# Patient Record
Sex: Female | Born: 1950 | Race: Black or African American | Hispanic: No | Marital: Married | Smoking: Former smoker
Health system: Southern US, Community
[De-identification: ages and names within clinical notes are randomized; demographics above are authoritative.]

## PROBLEM LIST (undated history)

## (undated) ENCOUNTER — Emergency Department (HOSPITAL_COMMUNITY): Admission: EM | Payer: 59 | Source: Home / Self Care

## (undated) DIAGNOSIS — M25519 Pain in unspecified shoulder: Secondary | ICD-10-CM

## (undated) DIAGNOSIS — K529 Noninfective gastroenteritis and colitis, unspecified: Secondary | ICD-10-CM

## (undated) DIAGNOSIS — Z87442 Personal history of urinary calculi: Secondary | ICD-10-CM

## (undated) DIAGNOSIS — E119 Type 2 diabetes mellitus without complications: Secondary | ICD-10-CM

## (undated) DIAGNOSIS — N189 Chronic kidney disease, unspecified: Secondary | ICD-10-CM

## (undated) DIAGNOSIS — J45909 Unspecified asthma, uncomplicated: Secondary | ICD-10-CM

## (undated) DIAGNOSIS — M1990 Unspecified osteoarthritis, unspecified site: Secondary | ICD-10-CM

## (undated) DIAGNOSIS — I10 Essential (primary) hypertension: Secondary | ICD-10-CM

## (undated) HISTORY — PX: OTHER SURGICAL HISTORY: SHX169

## (undated) HISTORY — DX: Noninfective gastroenteritis and colitis, unspecified: K52.9

## (undated) HISTORY — PX: TONSILLECTOMY: SUR1361

## (undated) HISTORY — PX: ROTATOR CUFF REPAIR: SHX139

## (undated) HISTORY — PX: ABDOMINAL HYSTERECTOMY: SHX81

---

## 2003-03-02 ENCOUNTER — Encounter: Payer: Self-pay | Admitting: *Deleted

## 2003-03-02 ENCOUNTER — Ambulatory Visit (HOSPITAL_COMMUNITY): Admission: RE | Admit: 2003-03-02 | Discharge: 2003-03-02 | Payer: Self-pay | Admitting: Family Medicine

## 2005-01-07 ENCOUNTER — Ambulatory Visit (HOSPITAL_COMMUNITY): Admission: RE | Admit: 2005-01-07 | Discharge: 2005-01-07 | Payer: Self-pay | Admitting: Occupational Therapy

## 2006-02-03 ENCOUNTER — Ambulatory Visit (HOSPITAL_COMMUNITY): Admission: RE | Admit: 2006-02-03 | Discharge: 2006-02-03 | Payer: Self-pay | Admitting: Nurse Practitioner

## 2006-02-26 ENCOUNTER — Ambulatory Visit (HOSPITAL_COMMUNITY): Admission: RE | Admit: 2006-02-26 | Discharge: 2006-02-26 | Payer: Self-pay | Admitting: Nurse Practitioner

## 2007-04-03 ENCOUNTER — Ambulatory Visit (HOSPITAL_COMMUNITY): Admission: RE | Admit: 2007-04-03 | Discharge: 2007-04-03 | Payer: Self-pay | Admitting: Nurse Practitioner

## 2007-11-12 ENCOUNTER — Emergency Department (HOSPITAL_COMMUNITY): Admission: EM | Admit: 2007-11-12 | Discharge: 2007-11-12 | Payer: Self-pay | Admitting: Emergency Medicine

## 2008-02-29 ENCOUNTER — Encounter: Payer: Self-pay | Admitting: Internal Medicine

## 2008-02-29 ENCOUNTER — Ambulatory Visit (HOSPITAL_COMMUNITY): Admission: RE | Admit: 2008-02-29 | Discharge: 2008-02-29 | Payer: Self-pay | Admitting: Internal Medicine

## 2008-02-29 ENCOUNTER — Ambulatory Visit: Payer: Self-pay | Admitting: Internal Medicine

## 2008-02-29 HISTORY — PX: COLONOSCOPY: SHX174

## 2008-04-05 ENCOUNTER — Ambulatory Visit (HOSPITAL_COMMUNITY): Admission: RE | Admit: 2008-04-05 | Discharge: 2008-04-05 | Payer: Self-pay | Admitting: Internal Medicine

## 2009-04-28 ENCOUNTER — Ambulatory Visit (HOSPITAL_COMMUNITY): Admission: RE | Admit: 2009-04-28 | Discharge: 2009-04-28 | Payer: Self-pay | Admitting: Internal Medicine

## 2010-03-10 ENCOUNTER — Emergency Department (HOSPITAL_COMMUNITY): Admission: EM | Admit: 2010-03-10 | Discharge: 2010-03-10 | Payer: Self-pay | Admitting: Emergency Medicine

## 2010-03-28 ENCOUNTER — Ambulatory Visit (HOSPITAL_COMMUNITY)
Admission: RE | Admit: 2010-03-28 | Discharge: 2010-03-28 | Payer: Self-pay | Source: Home / Self Care | Admitting: Family Medicine

## 2010-08-03 ENCOUNTER — Ambulatory Visit (HOSPITAL_COMMUNITY)
Admission: RE | Admit: 2010-08-03 | Discharge: 2010-08-03 | Payer: Self-pay | Source: Home / Self Care | Attending: Family Medicine | Admitting: Family Medicine

## 2010-12-11 NOTE — Op Note (Signed)
NAMECARMINE, YOUNGBERG               ACCOUNT NO.:  [id number]   MEDICAL RECORD NO.:  [id number]          PATIENT TYPE:  AMB   LOCATION:  DAY                           FACILITY:  [facility]   PHYSICIAN:  R. Roetta Sessions, M.D. DATE OF BIRTH:  26-Feb-1951   DATE OF PROCEDURE:  02/29/2008  DATE OF DISCHARGE:                               OPERATIVE REPORT   INDICATIONS FOR PROCEDURE:  The patient is a 60 year old African  American female from [place], who has a history of colonic polyps  removed by Dr. Allena Katz 8 years ago.  She has done well.  She is here for  surveillance.  She is referred by Dr. Claretta Fraise, [facility].  Colonoscopy is now being done as surveillance maneuver.  Risks, benefits, alternatives, and limitations have been reviewed.  Questions answered, all parties agreeable.  All questions answered.   PROCEDURE NOTE:  O2 saturation, blood pressure, pulse, and respirations  were monitored throughout the entire procedure.   CONSCIOUS SEDATION:  Versed 4 mg IV and Demerol 75 mg IV, in divided  doses.   INSTRUMENT:  Pentax video chip system.   FINDINGS:  Digital rectal exam revealed no abnormalities.  Endoscopic  findings:  Prep was adequate.  Colon:  Colonic mucosa was surveyed from  the rectosigmoid junction through the left transverse right colon to the  appendiceal orifice, ileocecal valve, and cecum.  The structures were  well seen and photographed for the record.  From this level, the scope  was slowly and cautiously withdrawn.  All previously mentioned mucosal  surfaces were again seen.  The patient was noted to have 2 diminutive  polyps in the cecum, which were cold biopsied/removed.  The remainder of  colonic mucosa appeared entirely normal.  The scope was pulled down the  rectum where thorough examination of the rectal mucosa including  retroflex view of the anal verge demonstrated a number of diminutive  polyps at least one small adenomatous-appearing  polyp and this was cold  biopsied.  The remainder were ablated with the tip of hot snare cautery  unit.  Otherwise, rectal mucosa appeared normal.  The patient tolerated  the procedure well and was reacted to endoscopy.   IMPRESSION:  1. Diminutive rectal polyp, status post biopsy and ablation as      described above.  2. Diminutive cecal polyps (2), status post cold biopsy removed.  The      remainder of colonic mucosa appeared normal.   RECOMMENDATIONS:  1. Follow up on path.  2. Further recommendations to follow.      Jonathon Bellows, M.D.  Electronically Signed     RMR/MEDQ  D:  02/29/2008  T:  02/29/2008  Job:  [id number]   cc:   Clair Gulling, Lucianne Lei [id number]  [place]

## 2011-04-23 LAB — COMPREHENSIVE METABOLIC PANEL
Alkaline Phosphatase: 98
Creatinine, Ser: 0.79
Glucose, Bld: 100 — ABNORMAL HIGH
Sodium: 139
Total Protein: 7.3

## 2011-04-23 LAB — DIFFERENTIAL
Basophils Absolute: 0
Eosinophils Absolute: 0.3
Monocytes Relative: 7
Neutro Abs: 5.5

## 2011-04-23 LAB — CBC
HCT: 41.1
Hemoglobin: 14.3
MCHC: 34.7
MCV: 88.7
Platelets: 201
RBC: 4.63
RDW: 13.6
WBC: 8.3

## 2011-04-23 LAB — AMYLASE: Amylase: 123

## 2011-04-25 ENCOUNTER — Other Ambulatory Visit (HOSPITAL_COMMUNITY): Payer: Self-pay | Admitting: Family Medicine

## 2011-04-25 DIAGNOSIS — Z139 Encounter for screening, unspecified: Secondary | ICD-10-CM

## 2011-05-17 ENCOUNTER — Ambulatory Visit (HOSPITAL_COMMUNITY)
Admission: RE | Admit: 2011-05-17 | Discharge: 2011-05-17 | Disposition: A | Discharge status: Home / Self Care | Payer: Managed Care, Other (non HMO) | Source: Ambulatory Visit | Attending: Family Medicine | Admitting: Family Medicine

## 2011-05-17 DIAGNOSIS — Z139 Encounter for screening, unspecified: Secondary | ICD-10-CM

## 2011-05-17 DIAGNOSIS — Z1231 Encounter for screening mammogram for malignant neoplasm of breast: Secondary | ICD-10-CM | POA: Insufficient documentation

## 2012-05-28 ENCOUNTER — Other Ambulatory Visit (HOSPITAL_COMMUNITY): Payer: Self-pay | Admitting: Family Medicine

## 2012-05-28 DIAGNOSIS — Z139 Encounter for screening, unspecified: Secondary | ICD-10-CM

## 2012-06-04 ENCOUNTER — Ambulatory Visit (HOSPITAL_COMMUNITY)
Admission: RE | Admit: 2012-06-04 | Discharge: 2012-06-04 | Disposition: A | Discharge status: Home / Self Care | Payer: Managed Care, Other (non HMO) | Source: Ambulatory Visit | Attending: Family Medicine | Admitting: Family Medicine

## 2012-06-04 DIAGNOSIS — Z139 Encounter for screening, unspecified: Secondary | ICD-10-CM

## 2012-06-04 DIAGNOSIS — Z1231 Encounter for screening mammogram for malignant neoplasm of breast: Secondary | ICD-10-CM | POA: Insufficient documentation

## 2013-04-28 DIAGNOSIS — K529 Noninfective gastroenteritis and colitis, unspecified: Secondary | ICD-10-CM

## 2013-04-28 HISTORY — DX: Noninfective gastroenteritis and colitis, unspecified: K52.9

## 2013-05-04 ENCOUNTER — Emergency Department (HOSPITAL_COMMUNITY): Payer: 59

## 2013-05-04 ENCOUNTER — Inpatient Hospital Stay (HOSPITAL_COMMUNITY)
Admission: EM | Admit: 2013-05-04 | Discharge: 2013-05-06 | DRG: 392 | Disposition: A | Discharge status: Home / Self Care | Payer: 59 | Attending: Internal Medicine | Admitting: Internal Medicine

## 2013-05-04 ENCOUNTER — Encounter (HOSPITAL_COMMUNITY): Payer: Self-pay | Admitting: *Deleted

## 2013-05-04 ENCOUNTER — Telehealth: Payer: Self-pay

## 2013-05-04 DIAGNOSIS — D72829 Elevated white blood cell count, unspecified: Secondary | ICD-10-CM | POA: Diagnosis present

## 2013-05-04 DIAGNOSIS — K529 Noninfective gastroenteritis and colitis, unspecified: Secondary | ICD-10-CM

## 2013-05-04 DIAGNOSIS — K5289 Other specified noninfective gastroenteritis and colitis: Principal | ICD-10-CM | POA: Diagnosis present

## 2013-05-04 DIAGNOSIS — J45909 Unspecified asthma, uncomplicated: Secondary | ICD-10-CM | POA: Diagnosis present

## 2013-05-04 DIAGNOSIS — Z87891 Personal history of nicotine dependence: Secondary | ICD-10-CM

## 2013-05-04 DIAGNOSIS — I10 Essential (primary) hypertension: Secondary | ICD-10-CM | POA: Diagnosis present

## 2013-05-04 DIAGNOSIS — K921 Melena: Secondary | ICD-10-CM

## 2013-05-04 DIAGNOSIS — E119 Type 2 diabetes mellitus without complications: Secondary | ICD-10-CM | POA: Diagnosis present

## 2013-05-04 DIAGNOSIS — E669 Obesity, unspecified: Secondary | ICD-10-CM | POA: Diagnosis present

## 2013-05-04 DIAGNOSIS — Z23 Encounter for immunization: Secondary | ICD-10-CM

## 2013-05-04 DIAGNOSIS — E1169 Type 2 diabetes mellitus with other specified complication: Secondary | ICD-10-CM

## 2013-05-04 DIAGNOSIS — E876 Hypokalemia: Secondary | ICD-10-CM | POA: Diagnosis present

## 2013-05-04 HISTORY — DX: Unspecified asthma, uncomplicated: J45.909

## 2013-05-04 HISTORY — DX: Type 2 diabetes mellitus without complications: E11.9

## 2013-05-04 HISTORY — DX: Essential (primary) hypertension: I10

## 2013-05-04 HISTORY — DX: Pain in unspecified shoulder: M25.519

## 2013-05-04 LAB — URINALYSIS W MICROSCOPIC + REFLEX CULTURE
Bilirubin Urine: NEGATIVE
Glucose, UA: NEGATIVE mg/dL
Hgb urine dipstick: NEGATIVE
Ketones, ur: NEGATIVE mg/dL
Protein, ur: NEGATIVE mg/dL
Specific Gravity, Urine: 1.02 (ref 1.005–1.030)

## 2013-05-04 LAB — CBC WITH DIFFERENTIAL/PLATELET
Basophils Relative: 0 % (ref 0–1)
Lymphocytes Relative: 28 % (ref 12–46)
MCH: 30 pg (ref 26.0–34.0)
MCHC: 33.7 g/dL (ref 30.0–36.0)
Monocytes Absolute: 0.8 10*3/uL (ref 0.1–1.0)
Monocytes Relative: 6 % (ref 3–12)
RBC: 4.87 MIL/uL (ref 3.87–5.11)
RDW: 13.6 % (ref 11.5–15.5)
WBC: 12.7 10*3/uL — ABNORMAL HIGH (ref 4.0–10.5)

## 2013-05-04 LAB — LIPASE, BLOOD: Lipase: 21 U/L (ref 11–59)

## 2013-05-04 LAB — OCCULT BLOOD, POC DEVICE: Fecal Occult Bld: POSITIVE — AB

## 2013-05-04 LAB — COMPREHENSIVE METABOLIC PANEL
AST: 22 U/L (ref 0–37)
Albumin: 3.8 g/dL (ref 3.5–5.2)
Alkaline Phosphatase: 84 U/L (ref 39–117)
CO2: 26 mEq/L (ref 19–32)
Chloride: 101 mEq/L (ref 96–112)
Creatinine, Ser: 0.68 mg/dL (ref 0.50–1.10)
GFR calc Af Amer: >90 mL/min (ref >90)
GFR calc non Af Amer: >90 mL/min (ref >90)
Glucose, Bld: 104 mg/dL — ABNORMAL HIGH (ref 70–99)
Potassium: 3 mEq/L — ABNORMAL LOW (ref 3.5–5.1)
Total Bilirubin: 0.3 mg/dL (ref 0.3–1.2)
Total Protein: 7.8 g/dL (ref 6.0–8.3)

## 2013-05-04 LAB — LACTIC ACID, PLASMA: Lactic Acid, Venous: 1.3 mmol/L (ref 0.5–2.2)

## 2013-05-04 MED ORDER — FLUTICASONE PROPIONATE 50 MCG/ACT NA SUSP
1.0000 | Freq: Every day | NASAL | Status: DC
  Administered 2013-05-05 – 2013-05-06 (×2): 1 via NASAL
  Filled 2013-05-04: qty 16

## 2013-05-04 MED ORDER — ONDANSETRON HCL 4 MG/2ML IJ SOLN: 4.0000 mg | Freq: Four times a day (QID) | INTRAMUSCULAR | Status: DC | PRN

## 2013-05-04 MED ORDER — LISINOPRIL 10 MG PO TABS
20.0000 mg | ORAL_TABLET | Freq: Every day | ORAL | Status: DC
  Administered 2013-05-05 – 2013-05-06 (×2): 20 mg via ORAL
  Filled 2013-05-04 (×2): qty 2

## 2013-05-04 MED ORDER — SODIUM CHLORIDE 0.9 % IV SOLN
INTRAVENOUS | Status: DC
  Administered 2013-05-04 – 2013-05-05 (×2): via INTRAVENOUS

## 2013-05-04 MED ORDER — LISINOPRIL-HYDROCHLOROTHIAZIDE 20-25 MG PO TABS: 1.0000 | ORAL_TABLET | Freq: Every day | ORAL | Status: DC

## 2013-05-04 MED ORDER — HYDROCHLOROTHIAZIDE 25 MG PO TABS
25.0000 mg | ORAL_TABLET | Freq: Every day | ORAL | Status: DC
  Administered 2013-05-05 – 2013-05-06 (×2): 25 mg via ORAL
  Filled 2013-05-04 (×2): qty 1

## 2013-05-04 MED ORDER — FLUTICASONE PROPIONATE 50 MCG/ACT NA SUSP
NASAL | Status: AC
  Filled 2013-05-04: qty 16

## 2013-05-04 MED ORDER — SIMVASTATIN 10 MG PO TABS
10.0000 mg | ORAL_TABLET | Freq: Every day | ORAL | Status: DC
  Administered 2013-05-05: 10 mg via ORAL
  Filled 2013-05-04: qty 1

## 2013-05-04 MED ORDER — MORPHINE SULFATE 4 MG/ML IJ SOLN
4.0000 mg | INTRAMUSCULAR | Status: AC | PRN
  Administered 2013-05-04 (×2): 4 mg via INTRAVENOUS
  Filled 2013-05-04 (×2): qty 1

## 2013-05-04 MED ORDER — ACETAMINOPHEN 325 MG PO TABS
650.0000 mg | ORAL_TABLET | Freq: Four times a day (QID) | ORAL | Status: DC | PRN
  Administered 2013-05-05 (×2): 650 mg via ORAL
  Filled 2013-05-04 (×3): qty 2

## 2013-05-04 MED ORDER — SODIUM CHLORIDE 0.9 % IV SOLN
INTRAVENOUS | Status: AC
  Administered 2013-05-04: 21:00:00 via INTRAVENOUS

## 2013-05-04 MED ORDER — ONDANSETRON HCL 4 MG/2ML IJ SOLN: 4.0000 mg | Freq: Three times a day (TID) | INTRAMUSCULAR | Status: AC | PRN

## 2013-05-04 MED ORDER — LORATADINE 10 MG PO TABS
10.0000 mg | ORAL_TABLET | Freq: Every day | ORAL | Status: DC
  Administered 2013-05-05 – 2013-05-06 (×2): 10 mg via ORAL
  Filled 2013-05-04 (×2): qty 1

## 2013-05-04 MED ORDER — IOHEXOL 300 MG/ML  SOLN
100.0000 mL | Freq: Once | INTRAMUSCULAR | Status: AC | PRN
  Administered 2013-05-04: 100 mL via INTRAVENOUS

## 2013-05-04 MED ORDER — IOHEXOL 300 MG/ML  SOLN
50.0000 mL | Freq: Once | INTRAMUSCULAR | Status: AC | PRN
  Administered 2013-05-04: 50 mL via ORAL

## 2013-05-04 MED ORDER — ONDANSETRON HCL 4 MG PO TABS: 4.0000 mg | ORAL_TABLET | Freq: Four times a day (QID) | ORAL | Status: DC | PRN

## 2013-05-04 MED ORDER — TRAMADOL HCL 50 MG PO TABS
50.0000 mg | ORAL_TABLET | Freq: Four times a day (QID) | ORAL | Status: DC | PRN
  Administered 2013-05-04 – 2013-05-05 (×3): 50 mg via ORAL
  Filled 2013-05-04 (×3): qty 1

## 2013-05-04 MED ORDER — MONTELUKAST SODIUM 10 MG PO TABS
10.0000 mg | ORAL_TABLET | Freq: Every day | ORAL | Status: DC
  Administered 2013-05-04 – 2013-05-05 (×2): 10 mg via ORAL
  Filled 2013-05-04 (×2): qty 1

## 2013-05-04 MED ORDER — INSULIN ASPART 100 UNIT/ML ~~LOC~~ SOLN: 0.0000 [IU] | Freq: Three times a day (TID) | SUBCUTANEOUS | Status: DC

## 2013-05-04 MED ORDER — ONDANSETRON HCL 4 MG/2ML IJ SOLN
4.0000 mg | INTRAMUSCULAR | Status: DC | PRN
  Administered 2013-05-04: 4 mg via INTRAVENOUS
  Filled 2013-05-04: qty 2

## 2013-05-04 MED ORDER — NAPHAZOLINE HCL 0.1 % OP SOLN
1.0000 [drp] | Freq: Four times a day (QID) | OPHTHALMIC | Status: DC | PRN
  Filled 2013-05-04: qty 15

## 2013-05-04 MED ORDER — FLUTICASONE PROPIONATE HFA 44 MCG/ACT IN AERO
2.0000 | INHALATION_SPRAY | Freq: Two times a day (BID) | RESPIRATORY_TRACT | Status: DC
  Administered 2013-05-05 – 2013-05-06 (×3): 2 via RESPIRATORY_TRACT
  Filled 2013-05-04: qty 10.6

## 2013-05-04 MED ORDER — POTASSIUM CHLORIDE CRYS ER 20 MEQ PO TBCR
40.0000 meq | EXTENDED_RELEASE_TABLET | Freq: Once | ORAL | Status: AC
  Administered 2013-05-04: 40 meq via ORAL
  Filled 2013-05-04: qty 2

## 2013-05-04 MED ORDER — METRONIDAZOLE IN NACL 5-0.79 MG/ML-% IV SOLN
500.0000 mg | Freq: Once | INTRAVENOUS | Status: AC
  Administered 2013-05-04: 500 mg via INTRAVENOUS
  Filled 2013-05-04: qty 100

## 2013-05-04 MED ORDER — INFLUENZA VAC SPLIT QUAD 0.5 ML IM SUSP
0.5000 mL | INTRAMUSCULAR | Status: AC
  Administered 2013-05-05: 0.5 mL via INTRAMUSCULAR
  Filled 2013-05-04: qty 0.5

## 2013-05-04 MED ORDER — CIPROFLOXACIN IN D5W 400 MG/200ML IV SOLN
400.0000 mg | Freq: Once | INTRAVENOUS | Status: AC
  Administered 2013-05-04: 400 mg via INTRAVENOUS
  Filled 2013-05-04: qty 200

## 2013-05-04 MED ORDER — ENOXAPARIN SODIUM 40 MG/0.4ML ~~LOC~~ SOLN
40.0000 mg | SUBCUTANEOUS | Status: DC
  Administered 2013-05-04 – 2013-05-05 (×2): 40 mg via SUBCUTANEOUS
  Filled 2013-05-04 (×2): qty 0.4

## 2013-05-04 MED ORDER — ACETAMINOPHEN 650 MG RE SUPP: 650.0000 mg | Freq: Four times a day (QID) | RECTAL | Status: DC | PRN

## 2013-05-04 MED ORDER — ALBUTEROL SULFATE HFA 108 (90 BASE) MCG/ACT IN AERS
2.0000 | INHALATION_SPRAY | Freq: Four times a day (QID) | RESPIRATORY_TRACT | Status: DC | PRN
Start: 2013-05-04 — End: 2013-05-06

## 2013-05-04 MED ORDER — SODIUM CHLORIDE 0.9 % IV SOLN
INTRAVENOUS | Status: DC
  Administered 2013-05-04: 1000 mL via INTRAVENOUS

## 2013-05-04 MED ORDER — HYDROMORPHONE HCL PF 1 MG/ML IJ SOLN: 0.5000 mg | INTRAMUSCULAR | Status: AC | PRN

## 2013-05-04 NOTE — Telephone Encounter (Signed)
Pt is still bleeding and her pain is all the way across her stomach above her belly button. She just went to the to the bathroom and the blood was darker then before. I advised her to go to the ER to get checked out.

## 2013-05-04 NOTE — ED Notes (Signed)
Upper abdominal cramping began yesterday around 1400 w/light pink blood in stool.  Now it is darker and mucoid.  Little nausea, vomiting w/clear, phlegm-like emesis.

## 2013-05-04 NOTE — Telephone Encounter (Signed)
Pt called today because she is having abd pain  With some sharpe pains. She is bleeding from rectum and it has mucous mixed it with it. Started Monday about 2:00 pm. She has been going all night and some still this morning.Please advise her call back is [phone].

## 2013-05-04 NOTE — Telephone Encounter (Signed)
Agree 

## 2013-05-04 NOTE — ED Provider Notes (Signed)
CSN: [id number]     Arrival date & time 05/04/13  1407 History   First MD Initiated Contact with Patient 05/04/13 1430     Chief Complaint  Patient presents with  . Abdominal Pain  . Rectal Bleeding    HPI Pt was seen at 1440.  Per pt, c/o gradual onset and persistence of constant rectal bleeding for the past 2 days. Has been associated with generalized abd "cramping" pain, as well as several episodes of N/V. Describes the rectal bleeding as passing "mucus with blood" per rectum. Denies diarrhea, no black stools, no black or blood in emesis. Denies recent travel or abx use. Denies CP/SOB, no fevers, no back pain.    Past Medical History  Diagnosis Date  . Diabetes mellitus without complication   . Hypertension   . Shoulder pain   . Asthma    Past Surgical History  Procedure Laterality Date  . Rotator cuff repair    . Fibroids removed    . Abdominal hysterectomy      partial  . Tonsillectomy     Family History  Problem Relation Age of Onset  . Cancer Mother    History  Substance Use Topics  . Smoking status: Former Games developer  . Smokeless tobacco: Never Used  . Alcohol Use: 0.6 oz/week    1 Glasses of wine per week     Comment: socially    Review of Systems ROS: Statement: All systems negative except as marked or noted in the HPI; Constitutional: Negative for fever and chills. ; ; Eyes: Negative for eye pain, redness and discharge. ; ; ENMT: Negative for ear pain, hoarseness, nasal congestion, sinus pressure and sore throat. ; ; Cardiovascular: Negative for chest pain, palpitations, diaphoresis, dyspnea and peripheral edema. ; ; Respiratory: Negative for cough, wheezing and stridor. ; ; Gastrointestinal: +N/V, abd pain, rectal bleeding. Negative for hematemesis, jaundice. . ; ; Genitourinary: Negative for dysuria, flank pain and hematuria. ; ; Musculoskeletal: Negative for back pain and neck pain. Negative for swelling and trauma.; ; Skin: Negative for pruritus, rash, abrasions,  blisters, bruising and skin lesion.; ; Neuro: Negative for headache, lightheadedness and neck stiffness. Negative for weakness, altered level of consciousness , altered mental status, extremity weakness, paresthesias, involuntary movement, seizure and syncope.       Allergies  Aspirin  Home Medications   Current Outpatient Rx  Name  Route  Sig  Dispense  Refill  . albuterol (PROVENTIL HFA;VENTOLIN HFA) 108 (90 BASE) MCG/ACT inhaler   Inhalation   Inhale 2 puffs into the lungs every 6 (six) hours as needed for wheezing or shortness of breath.         . budesonide (PULMICORT) 180 MCG/ACT inhaler   Inhalation   Inhale 1 puff into the lungs 2 (two) times daily.         . Calcium Carbonate-Vitamin D (CALTRATE 600+D PO)   Oral   Take 2 tablets by mouth daily.         . Camphor-Menthol-Methyl Sal (SALONPAS) 1.2-5.7-6.3 % PTCH   Apply externally   Apply 1 patch topically daily as needed (for right shoulder pain).         . cetirizine (ZYRTEC) 10 MG tablet   Oral   Take 10 mg by mouth at bedtime.         . fluticasone (FLONASE) 50 MCG/ACT nasal spray   Nasal   Place 1 spray into the nose daily.         Marland Kitchen  ibuprofen (ADVIL,MOTRIN) 200 MG tablet   Oral   Take 200 mg by mouth daily.         Marland Kitchen lisinopril-hydrochlorothiazide (PRINZIDE,ZESTORETIC) 20-25 MG per tablet   Oral   Take 1 tablet by mouth daily.         . metFORMIN (GLUCOPHAGE) 500 MG tablet   Oral   Take 500 mg by mouth daily.         . montelukast (SINGULAIR) 10 MG tablet   Oral   Take 10 mg by mouth at bedtime.         . naphazoline (NAPHCON) 0.1 % ophthalmic solution   Both Eyes   Place 1 drop into both eyes 4 (four) times daily as needed (for allergy eye relief).         . pravastatin (PRAVACHOL) 20 MG tablet   Oral   Take 20 mg by mouth at bedtime.         . traMADol (ULTRAM) 50 MG tablet   Oral   Take 50 mg by mouth every 6 (six) hours as needed for pain.          BP 176/93   Pulse 77  Temp(Src) 98.5 F (36.9 C)  Resp 17  Ht 5\' 3"  (1.6 m)  Wt 183 lb 14.4 oz (83.416 kg)  BMI 32.58 kg/m2  SpO2 98% Physical Exam 1445: Physical examination:  Nursing notes reviewed; Vital signs and O2 SAT reviewed;  Constitutional: Well developed, Well nourished, Well hydrated, In no acute distress; Head:  Normocephalic, atraumatic; Eyes: EOMI, PERRL, No scleral icterus; ENMT: Mouth and pharynx normal, Mucous membranes moist; Neck: Supple, Full range of motion, No lymphadenopathy; Cardiovascular: Regular rate and rhythm, No murmur, rub, or gallop; Respiratory: Breath sounds clear & equal bilaterally, No rales, rhonchi, wheezes.  Speaking full sentences with ease, Normal respiratory effort/excursion; Chest: Nontender, Movement normal; Abdomen: Soft, +diffuse tenderness to palp. No rebound or guarding. Nondistended, Normal bowel sounds. Rectal exam performed w/permission of pt and ED RN chaperone present.  Anal tone normal.  Non-tender, soft maroon stool in rectal vault, heme positive.  No fissures, no external hemorrhoids, no palp masses. ; Genitourinary: No CVA tenderness; Extremities: Pulses normal, No tenderness, No edema, No calf edema or asymmetry.; Neuro: AA&Ox3, Major CN grossly intact.  Speech clear. No gross focal motor or sensory deficits in extremities.; Skin: Color normal, Warm, Dry.   ED Course  Procedures     MDM  MDM Reviewed: previous chart, nursing note and vitals Reviewed previous: labs Interpretation: labs, x-ray and CT scan     Results for orders placed during the hospital encounter of 05/04/13  CBC WITH DIFFERENTIAL      Result Value Range   WBC 12.7 (*) 4.0 - 10.5 K/uL   RBC 4.87  3.87 - 5.11 MIL/uL   Hemoglobin 14.6  12.0 - 15.0 g/dL   HCT 21.3  08.6 - 57.8 %   MCV 88.9  78.0 - 100.0 fL   MCH 30.0  26.0 - 34.0 pg   MCHC 33.7  30.0 - 36.0 g/dL   RDW 46.9  62.9 - 52.8 %   Platelets 212  150 - 400 K/uL   Neutrophils Relative % 65  43 - 77 %   Neutro  Abs 8.3 (*) 1.7 - 7.7 K/uL   Lymphocytes Relative 28  12 - 46 %   Lymphs Abs 3.5  0.7 - 4.0 K/uL   Monocytes Relative 6  3 - 12 %   Monocytes Absolute  0.8  0.1 - 1.0 K/uL   Eosinophils Relative 1  0 - 5 %   Eosinophils Absolute 0.1  0.0 - 0.7 K/uL   Basophils Relative 0  0 - 1 %   Basophils Absolute 0.0  0.0 - 0.1 K/uL  COMPREHENSIVE METABOLIC PANEL      Result Value Range   Sodium 139  135 - 145 mEq/L   Potassium 3.0 (*) 3.5 - 5.1 mEq/L   Chloride 101  96 - 112 mEq/L   CO2 26  19 - 32 mEq/L   Glucose, Bld 104 (*) 70 - 99 mg/dL   BUN 9  6 - 23 mg/dL   Creatinine, Ser 2.95  0.50 - 1.10 mg/dL   Calcium 9.7  8.4 - 28.4 mg/dL   Total Protein 7.8  6.0 - 8.3 g/dL   Albumin 3.8  3.5 - 5.2 g/dL   AST 22  0 - 37 U/L   ALT 25  0 - 35 U/L   Alkaline Phosphatase 84  39 - 117 U/L   Total Bilirubin 0.3  0.3 - 1.2 mg/dL   GFR calc non Af Amer >90  >90 mL/min   GFR calc Af Amer >90  >90 mL/min  LIPASE, BLOOD      Result Value Range   Lipase 21  11 - 59 U/L  LACTIC ACID, PLASMA      Result Value Range   Lactic Acid, Venous 1.3  0.5 - 2.2 mmol/L  URINALYSIS W MICROSCOPIC + REFLEX CULTURE      Result Value Range   Color, Urine YELLOW  YELLOW   APPearance CLEAR  CLEAR   Specific Gravity, Urine 1.020  1.005 - 1.030   pH 6.0  5.0 - 8.0   Glucose, UA NEGATIVE  NEGATIVE mg/dL   Hgb urine dipstick NEGATIVE  NEGATIVE   Bilirubin Urine NEGATIVE  NEGATIVE   Ketones, ur NEGATIVE  NEGATIVE mg/dL   Protein, ur NEGATIVE  NEGATIVE mg/dL   Urobilinogen, UA 0.2  0.0 - 1.0 mg/dL   Nitrite NEGATIVE  NEGATIVE   Leukocytes, UA TRACE (*) NEGATIVE   WBC, UA 3-6  <3 WBC/hpf   Bacteria, UA RARE  RARE   Squamous Epithelial / LPF FEW (*) RARE  OCCULT BLOOD, POC DEVICE      Result Value Range   Fecal Occult Bld POSITIVE (*) NEGATIVE    1730:  T/C from [facility] Dr. Tyron Russell: CT with significant colitis of descending colon. Will start IV abx. Dx and testing d/w pt and family.  Questions answered.  Verb  understanding, agreeable to admit.  T/C to [facility] Dr. Irene Limbo, case discussed, including:  HPI, pertinent PM/SHx, VS/PE, dx testing, ED course and treatment:  Agreeable to admit, requests to write temporary orders, obtain medical bed to team 1.       Laray Anger, DO 05/07/13 1342

## 2013-05-04 NOTE — H&P (Signed)
History and Physical  Colleen Dixon RUE:[id number] DOB: Oct 28, 1950 DOA: 05/04/2013  Referring physician: Dr. Clarene Duke PCP: Arlyss Queen, MD   Chief Complaint: Abdominal pain, blood in bowel movement  HPI:  62 year old woman with sudden onset 10/6 of abdominal pain, nausea, vomiting and bloody mucus. She presented to the emergency department where screening evaluation was generally unremarkable but CT of the abdomen and pelvis revealed descending colitis.  History obtained from patient bedside. Her symptoms were sudden in onset as described above. She has had numerous episodes of vomiting and nausea. She said no frank bleeding from her rectum but has had some bloody tinged mucous. No melena or blood clots noted. No diarrhea. No sick contacts. No described aggravating or alleviating factors.  In the emergency department now to be afebrile with stable vital signs. Potassium 3.0. CBC mild leukocytosis. Urinalysis unremarkable. Imaging as below.  Review of Systems:  Negative for fever, visual changes, sore throat, rash, new muscle aches, chest pain, SOB, dysuria,   Past Medical History  Diagnosis Date  . Diabetes mellitus without complication   . Hypertension   . Shoulder pain   . Asthma     Past Surgical History  Procedure Laterality Date  . Rotator cuff repair    . Fibroids removed    . Abdominal hysterectomy      partial  . Tonsillectomy      Social History:  reports that she has quit smoking. She has never used smokeless tobacco. She reports that she drinks about 0.6 ounces of alcohol per week. She reports that she does not use illicit drugs.  Allergies  Allergen Reactions  . Aspirin Nausea Only    Family History  Problem Relation Age of Onset  . Cancer Mother      Prior to Admission medications   Medication Sig Start Date End Date Taking? Authorizing Provider  albuterol (PROVENTIL HFA;VENTOLIN HFA) 108 (90 BASE) MCG/ACT inhaler Inhale 2 puffs into the lungs  every 6 (six) hours as needed for wheezing or shortness of breath.   Yes Historical Provider, MD  budesonide (PULMICORT) 180 MCG/ACT inhaler Inhale 1 puff into the lungs 2 (two) times daily.   Yes Historical Provider, MD  Calcium Carbonate-Vitamin D (CALTRATE 600+D PO) Take 2 tablets by mouth daily.   Yes Historical Provider, MD  Camphor-Menthol-Methyl Sal (SALONPAS) 1.2-5.7-6.3 % PTCH Apply 1 patch topically daily as needed (for right shoulder pain).   Yes Historical Provider, MD  cetirizine (ZYRTEC) 10 MG tablet Take 10 mg by mouth at bedtime.   Yes Historical Provider, MD  fluticasone (FLONASE) 50 MCG/ACT nasal spray Place 1 spray into the nose daily.   Yes Historical Provider, MD  ibuprofen (ADVIL,MOTRIN) 200 MG tablet Take 200 mg by mouth daily.   Yes Historical Provider, MD  lisinopril-hydrochlorothiazide (PRINZIDE,ZESTORETIC) 20-25 MG per tablet Take 1 tablet by mouth daily.   Yes Historical Provider, MD  metFORMIN (GLUCOPHAGE) 500 MG tablet Take 500 mg by mouth daily.   Yes Historical Provider, MD  montelukast (SINGULAIR) 10 MG tablet Take 10 mg by mouth at bedtime.   Yes Historical Provider, MD  naphazoline (NAPHCON) 0.1 % ophthalmic solution Place 1 drop into both eyes 4 (four) times daily as needed (for allergy eye relief).   Yes Historical Provider, MD  pravastatin (PRAVACHOL) 20 MG tablet Take 20 mg by mouth at bedtime.   Yes Historical Provider, MD  traMADol (ULTRAM) 50 MG tablet Take 50 mg by mouth every 6 (six) hours as needed for pain.  Yes Historical Provider, MD   Physical Exam: Filed Vitals:   05/04/13 1410 05/04/13 1800  BP: 176/93 170/77  Pulse: 77 64  Temp: 98.5 F (36.9 C)   Resp: 17 18  Height: 5\' 3"  (1.6 m)   Weight: 83.416 kg (183 lb 14.4 oz)   SpO2: 98% 98%    General: Examined in emergency department. Appears calm and comfortable Eyes: PERRL, normal lids, irises ENT: grossly normal hearing, lips & tongue Neck: no LAD, masses or thyromegaly Cardiovascular:  RRR, no m/r/g. No LE edema. Respiratory: CTA bilaterally, no w/r/r. Normal respiratory effort. Abdomen: soft, ntnd Skin: no rash or induration seen Musculoskeletal: grossly normal tone BUE/BLE Psychiatric: grossly normal mood and affect, speech fluent and appropriate Neurologic: grossly non-focal.  Wt Readings from Last 3 Encounters:  05/04/13 83.416 kg (183 lb 14.4 oz)    Labs on Admission:  Basic Metabolic Panel:  Recent Labs Lab 05/04/13 1509  NA 139  K 3.0*  CL 101  CO2 26  GLUCOSE 104*  BUN 9  CREATININE 0.68  CALCIUM 9.7    Liver Function Tests:  Recent Labs Lab 05/04/13 1509  AST 22  ALT 25  ALKPHOS 84  BILITOT 0.3  PROT 7.8  ALBUMIN 3.8    Recent Labs Lab 05/04/13 1509  LIPASE 21    CBC:  Recent Labs Lab 05/04/13 1509  WBC 12.7*  NEUTROABS 8.3*  HGB 14.6  HCT 43.3  MCV 88.9  PLT 212    Radiological Exams on Admission: No results found. Chest x-ray no acute disease. CT of the abdomen and pelvis: Descending colitis; differential diagnosis includes infection nflammatory bowel disease, less likely ischemia.  Principal Problem:   Acute colitis Active Problems:   Diabetes mellitus type 2 in obese   Assessment/Plan 1. Descending colitis: Suspect infectious. Empiric antibiotics. Pain control, antiemetics. No evidence of significant bleeding. Hemoglobin normal. Monitor clinically. 2. Hypokalemia: Replete.  3. Diabetes mellitus type 2: Sliding-scale insulin. Hold metformin.  Code Status: Full code  DVT prophylaxis: Lovenox Family Communication: Discussed with husband at bedside Disposition Plan/Anticipated LOS: Admit. 48 hours to 72 hours.  Time spent: 50 minutes  Brendia Sacks, MD  [facility] Pager [phone] 05/04/2013, 6:16 PM

## 2013-05-04 NOTE — Telephone Encounter (Signed)
Is bleeding still going on? Needs to have an urgent office visit or go to the ED. We haven't seen her in several years.

## 2013-05-05 DIAGNOSIS — K921 Melena: Secondary | ICD-10-CM | POA: Diagnosis present

## 2013-05-05 DIAGNOSIS — E876 Hypokalemia: Secondary | ICD-10-CM | POA: Diagnosis present

## 2013-05-05 LAB — CBC
HCT: 38.8 % (ref 36.0–46.0)
MCH: 29.5 pg (ref 26.0–34.0)
MCHC: 32.7 g/dL (ref 30.0–36.0)
MCV: 90.2 fL (ref 78.0–100.0)
RBC: 4.3 MIL/uL (ref 3.87–5.11)
RDW: 13.7 % (ref 11.5–15.5)

## 2013-05-05 LAB — GLUCOSE, CAPILLARY
Glucose-Capillary: 91 mg/dL (ref 70–99)
Glucose-Capillary: 72 mg/dL (ref 70–99)
Glucose-Capillary: 102 mg/dL — ABNORMAL HIGH (ref 70–99)

## 2013-05-05 LAB — BASIC METABOLIC PANEL
BUN: 8 mg/dL (ref 6–23)
CO2: 27 mEq/L (ref 19–32)
Chloride: 103 mEq/L (ref 96–112)
Creatinine, Ser: 0.78 mg/dL (ref 0.50–1.10)
GFR calc Af Amer: >90 mL/min (ref >90)
Potassium: 3.3 mEq/L — ABNORMAL LOW (ref 3.5–5.1)
Sodium: 138 mEq/L (ref 135–145)

## 2013-05-05 MED ORDER — CIPROFLOXACIN IN D5W 400 MG/200ML IV SOLN
400.0000 mg | Freq: Two times a day (BID) | INTRAVENOUS | Status: DC
  Administered 2013-05-05: 400 mg via INTRAVENOUS
  Filled 2013-05-05 (×4): qty 200

## 2013-05-05 MED ORDER — POTASSIUM CHLORIDE CRYS ER 20 MEQ PO TBCR
40.0000 meq | EXTENDED_RELEASE_TABLET | Freq: Once | ORAL | Status: AC
  Administered 2013-05-05: 40 meq via ORAL
  Filled 2013-05-05: qty 2

## 2013-05-05 MED ORDER — METRONIDAZOLE IN NACL 5-0.79 MG/ML-% IV SOLN
500.0000 mg | Freq: Three times a day (TID) | INTRAVENOUS | Status: DC
  Administered 2013-05-05: 500 mg via INTRAVENOUS
  Filled 2013-05-05 (×4): qty 100

## 2013-05-05 MED ORDER — METRONIDAZOLE 500 MG PO TABS
500.0000 mg | ORAL_TABLET | Freq: Three times a day (TID) | ORAL | Status: DC
  Administered 2013-05-05 – 2013-05-06 (×3): 500 mg via ORAL
  Filled 2013-05-05 (×3): qty 1

## 2013-05-05 MED ORDER — CIPROFLOXACIN HCL 250 MG PO TABS
500.0000 mg | ORAL_TABLET | Freq: Two times a day (BID) | ORAL | Status: DC
  Administered 2013-05-05 – 2013-05-06 (×2): 500 mg via ORAL
  Filled 2013-05-05 (×3): qty 2

## 2013-05-05 NOTE — Care Management Note (Addendum)
    Page 1 of 1   05/06/2013     2:08:05 PM   CARE MANAGEMENT NOTE 05/06/2013  Patient:  Colleen Dixon   Account Number:  [id number]  Date Initiated:  05/05/2013  Documentation initiated by:  Sharrie Rothman  Subjective/Objective Assessment:   Pt admitted from home with abd pain and bloody stools. Pt lives with her husband and will return home at discharge. Pt is independent with ADL's.     Action/Plan:   No CM needs noted.   Anticipated DC Date:  05/06/2013   Anticipated DC Plan:  HOME/SELF CARE      DC Planning Services  CM consult      Choice offered to / List presented to:             Status of service:  Completed, signed off Medicare Important Message given?   (If response is "NO", the following Medicare IM given date fields will be blank) Date Medicare IM given:   Date Additional Medicare IM given:    Discharge Disposition:  HOME/SELF CARE  Per UR Regulation:    If discussed at Long Length of Stay Meetings, dates discussed:    Comments:  05/06/13 1407 Arlyss Queen, RN BSN CM Pt discharged home today. No CM needs noted.  05/05/13 1150 Arlyss Queen, RN BSN CM

## 2013-05-05 NOTE — Progress Notes (Signed)
UR chart review completed.  

## 2013-05-05 NOTE — Progress Notes (Signed)
[facility] PROGRESS NOTE  Colleen Dixon ZOX:[id number] DOB: October 24, 1950 DOA: 05/04/2013 PCP: Arlyss Queen, MD  Assessment/Plan: 1. Acute colitis, presumed infectious.  Stool c diff is pending.  Continue antibiotics for now.  Will need outpatient colonoscopy once acute issues have resolved 2. Hematochezia.  Likely related to colitis.  Will continue to follow serial hemoglobins 3. Hypokalemia.  Replace 4. Diabetes 2. Use sliding scale insulin  Code Status: full code Family Communication: discussed with patient and family  Disposition Plan: possible discharge home in am if improved   Consultants:  none  Procedures:  none  Antibiotics:  Ciprofloxacin 10/7  Flagyl 10/7  HPI/Subjective: Feeling better.  Abdominal pain has improved.  No nausea or vomiting.  Still passing stools that are mainly mucous and blood.  Objective: Filed Vitals:   05/05/13 1311  BP: 115/45  Pulse: 60  Temp: 97.6 F (36.4 C)  Resp: 18    Intake/Output Summary (Last 24 hours) at 05/05/13 1550 Last data filed at 05/05/13 1357  Gross per 24 hour  Intake   1400 ml  Output      2 ml  Net   1398 ml   Filed Weights   05/04/13 1410 05/04/13 2005  Weight: 83.416 kg (183 lb 14.4 oz) 87.7 kg (193 lb 5.5 oz)    Exam:   General:  nad  Cardiovascular: s1, s2, rrr  Respiratory: cta b  Abdomen: soft, nt, nd, bs+  Musculoskeletal: no pedal edema b/l   Data Reviewed: Basic Metabolic Panel:  Recent Labs Lab 05/04/13 1509 05/05/13 0520  NA 139 138  K 3.0* 3.3*  CL 101 103  CO2 26 27  GLUCOSE 104* 101*  BUN 9 8  CREATININE 0.68 0.78  CALCIUM 9.7 8.8   Liver Function Tests:  Recent Labs Lab 05/04/13 1509  AST 22  ALT 25  ALKPHOS 84  BILITOT 0.3  PROT 7.8  ALBUMIN 3.8    Recent Labs Lab 05/04/13 1509  LIPASE 21   No results found for this basename: AMMONIA,  in the last 168 hours CBC:  Recent Labs Lab 05/04/13 1509 05/05/13 0520  WBC 12.7* 10.1   NEUTROABS 8.3*  --   HGB 14.6 12.7  HCT 43.3 38.8  MCV 88.9 90.2  PLT 212 193   Cardiac Enzymes: No results found for this basename: CKTOTAL, CKMB, CKMBINDEX, TROPONINI,  in the last 168 hours BNP (last 3 results) No results found for this basename: PROBNP,  in the last 8760 hours CBG:  Recent Labs Lab 05/04/13 1916 05/04/13 2224 05/05/13 0729 05/05/13 1129  GLUCAP 100* 92 95 91    No results found for this or any previous visit (from the past 240 hour(s)).   Studies: Dg Chest 2 View  05/04/2013   CLINICAL DATA:  Abdominal pain, nausea and vomiting.  EXAM: CHEST  2 VIEW  COMPARISON:  No priors.  FINDINGS: Lung volumes are normal. No consolidative airspace disease. No pleural effusions. No pneumothorax. No pulmonary nodule or mass noted. Pulmonary vasculature and the cardiomediastinal silhouette are within normal limits. Atherosclerosis in the thoracic aorta.  IMPRESSION: 1.  No radiographic evidence of acute cardiopulmonary disease. 2. Atherosclerosis.   Electronically Signed   By: Trudie Reed M.D.   On: 05/04/2013 16:51   Ct Abdomen Pelvis W Contrast  05/04/2013   CLINICAL DATA:  Abdominal pain, rectal bleeding, nausea, vomiting  EXAM: CT ABDOMEN AND PELVIS WITH CONTRAST  TECHNIQUE: Multidetector CT imaging of the abdomen and pelvis was performed using  the standard protocol following bolus administration of intravenous contrast. Sagittal and coronal MPR images reconstructed from axial data set.  CONTRAST:  50mL OMNIPAQUE IOHEXOL 300 MG/ML SOLN, OMNIPAQUE IOHEXOL 300 MG/ML SOLN  COMPARISON:  None  FINDINGS: Minimal bibasilar atelectasis.  Nonobstructing 6 mm diameter 11 mm length calculus at lower pole left kidney.  Liver, spleen, pancreas, kidneys, and adrenal glands otherwise normal appearance.  Normal appendix.  Significant wall thickening of descending colon with pericolic infiltrative changes compatible with descending colitis.  Stomach, small bowel loops, and remainder  of colon normal in appearance.  Unremarkable bladder and ureters.  Few scattered atherosclerotic calcifications.  No mass, adenopathy, or free air.  Tiny amount of dependent free pelvic fluid.  SI joint sclerosis bilaterally.  No acute osseous findings.  IMPRESSION: Descending colitis; differential diagnosis includes infection, inflammatory bowel disease, less likely ischemia.  Nonobstructing 6 x 11 mm calculus at lower pole left kidney.  Findings discussed with Dr. Clarene Duke on 05/04/2013 at 1705 hr.   Electronically Signed   By: Ulyses Southward M.D.   On: 05/04/2013 17:10    Scheduled Meds: . ciprofloxacin  400 mg Intravenous Q12H  . enoxaparin (LOVENOX) injection  40 mg Subcutaneous Q24H  . fluticasone  1 spray Each Nare Daily  . fluticasone  2 puff Inhalation BID  . hydrochlorothiazide  25 mg Oral Daily  . influenza vac split quadrivalent PF  0.5 mL Intramuscular Tomorrow-1000  . insulin aspart  0-9 Units Subcutaneous TID WC  . lisinopril  20 mg Oral Daily  . loratadine  10 mg Oral Daily  . metronidazole  500 mg Intravenous Q8H  . montelukast  10 mg Oral QHS  . potassium chloride  40 mEq Oral Once  . simvastatin  10 mg Oral q1800   Continuous Infusions: . sodium chloride 75 mL/hr at 05/05/13 1240    Principal Problem:   Acute colitis Active Problems:   Diabetes mellitus type 2 in obese    Time spent:    [facility]  [facility] Pager [phone]. If 7PM-7AM, please contact night-coverage at [email or web address], password [facility] 05/05/2013, 3:50 PM  LOS: 1 day

## 2013-05-06 LAB — BASIC METABOLIC PANEL
BUN: 11 mg/dL (ref 6–23)
CO2: 27 mEq/L (ref 19–32)
Calcium: 9.3 mg/dL (ref 8.4–10.5)
Chloride: 102 mEq/L (ref 96–112)
Creatinine, Ser: 0.78 mg/dL (ref 0.50–1.10)
GFR calc Af Amer: >90 mL/min (ref >90)
GFR calc non Af Amer: 88 mL/min — ABNORMAL LOW (ref >90)
Glucose, Bld: 95 mg/dL (ref 70–99)
Potassium: 3.7 mEq/L (ref 3.5–5.1)
Sodium: 137 mEq/L (ref 135–145)

## 2013-05-06 LAB — GLUCOSE, CAPILLARY: Glucose-Capillary: 97 mg/dL (ref 70–99)

## 2013-05-06 MED ORDER — CIPROFLOXACIN HCL 500 MG PO TABS: 500.0000 mg | ORAL_TABLET | Freq: Two times a day (BID) | ORAL | Status: DC

## 2013-05-06 MED ORDER — METRONIDAZOLE 500 MG PO TABS: 500.0000 mg | ORAL_TABLET | Freq: Three times a day (TID) | ORAL | Status: DC

## 2013-05-06 NOTE — Discharge Summary (Signed)
Physician Discharge Summary  Colleen Dixon ZOX:[id number] DOB: December 31, 1950 DOA: 05/04/2013  PCP: Arlyss Queen, MD  Admit date: 05/04/2013 Discharge date: 05/06/2013  Time spent: 45 minutes  Recommendations for Outpatient Follow-up:  1. Patient has outpatient appt with GI in 2 weeks, she has been asked to keep this appointment 2. Follow up with primary care doctor in 2 weeks  Discharge Diagnoses:  Principal Problem:   Acute colitis Active Problems:   Diabetes mellitus type 2 in obese   Bloody stools   Hypokalemia   Discharge Condition: improved  Diet recommendation: low salt, low fiber  Filed Weights   05/04/13 1410 05/04/13 2005  Weight: 83.416 kg (183 lb 14.4 oz) 87.7 kg (193 lb 5.5 oz)    History of present illness:  62 year old woman with sudden onset 10/6 of abdominal pain, nausea, vomiting and bloody mucus. She presented to the emergency department where screening evaluation was generally unremarkable but CT of the abdomen and pelvis revealed descending colitis.  History obtained from patient bedside. Her symptoms were sudden in onset as described above. She has had numerous episodes of vomiting and nausea. She said no frank bleeding from her rectum but has had some bloody tinged mucous. No melena or blood clots noted. No diarrhea. No sick contacts. No described aggravating or alleviating factors.  In the emergency department now to be afebrile with stable vital signs. Potassium 3.0. CBC mild leukocytosis. Urinalysis unremarkable. Imaging as below.   Hospital Course:  This lady was admitted to the hospital with abdominal pain and blood in her bowel movement. She does not have an acute colitis on CT scan. She was started empirically on antibiotics. Her loose stool has since resolved. She's not having any further bleeding today. Hemoglobin has been stable. She's not been febrile. Her diet has been a pansystolic. She's been tolerating this. No vomiting. Abdominal pain has  significantly improved. Her abdominal examination is benign. The patient advised not to take any further ibuprofen. She has an outpatient appointment with GI already scheduled. The patient will be discharged home today  Procedures:  none  Consultations:  none  Discharge Exam: Filed Vitals:   05/06/13 0958  BP: 117/64  Pulse: 57  Temp: 97.8 F (36.6 C)  Resp: 20    General: NAD Cardiovascular: s1, s2, rrr Respiratory: cta b  Discharge Instructions  Discharge Orders   Future Orders Complete By Expires   Call MD for:  persistant nausea and vomiting  As directed    Call MD for:  severe uncontrolled pain  As directed    Call MD for:  temperature >100.4  As directed    Diet - low sodium heart healthy  As directed    Increase activity slowly  As directed        Medication List    STOP taking these medications       ibuprofen 200 MG tablet  Commonly known as:  ADVIL,MOTRIN      TAKE these medications       albuterol 108 (90 BASE) MCG/ACT inhaler  Commonly known as:  PROVENTIL HFA;VENTOLIN HFA  Inhale 2 puffs into the lungs every 6 (six) hours as needed for wheezing or shortness of breath.     budesonide 180 MCG/ACT inhaler  Commonly known as:  PULMICORT  Inhale 1 puff into the lungs 2 (two) times daily.     CALTRATE 600+D PO  Take 2 tablets by mouth daily.     cetirizine 10 MG tablet  Commonly known as:  ZYRTEC  Take 10 mg by mouth at bedtime.     ciprofloxacin 500 MG tablet  Commonly known as:  CIPRO  Take 1 tablet (500 mg total) by mouth 2 (two) times daily.     fluticasone 50 MCG/ACT nasal spray  Commonly known as:  FLONASE  Place 1 spray into the nose daily.     lisinopril-hydrochlorothiazide 20-25 MG per tablet  Commonly known as:  PRINZIDE,ZESTORETIC  Take 1 tablet by mouth daily.     metFORMIN 500 MG tablet  Commonly known as:  GLUCOPHAGE  Take 500 mg by mouth daily.     metroNIDAZOLE 500 MG tablet  Commonly known as:  FLAGYL  Take 1  tablet (500 mg total) by mouth every 8 (eight) hours.     montelukast 10 MG tablet  Commonly known as:  SINGULAIR  Take 10 mg by mouth at bedtime.     naphazoline 0.1 % ophthalmic solution  Commonly known as:  NAPHCON  Place 1 drop into both eyes 4 (four) times daily as needed (for allergy eye relief).     pravastatin 20 MG tablet  Commonly known as:  PRAVACHOL  Take 20 mg by mouth at bedtime.     SALONPAS 1.2-5.7-6.3 % Ptch  Generic drug:  Camphor-Menthol-Methyl Sal  Apply 1 patch topically daily as needed (for right shoulder pain).     traMADol 50 MG tablet  Commonly known as:  ULTRAM  Take 50 mg by mouth every 6 (six) hours as needed for pain.       Allergies  Allergen Reactions  . Aspirin Nausea Only       Follow-up Information   Follow up with Eula Listen, MD. (as scheduled)    Specialty:  Gastroenterology   Contact information:   [address] [place] [address] [phone]       Follow up with Arlyss Queen, MD. Schedule an appointment as soon as possible for a visit in 2 weeks.   Specialty:  Family Medicine   Contact information:   [address] [place] [address] [phone]        The results of significant diagnostics from this hospitalization (including imaging, microbiology, ancillary and laboratory) are listed below for reference.    Significant Diagnostic Studies: Dg Chest 2 View  05/04/2013   CLINICAL DATA:  Abdominal pain, nausea and vomiting.  EXAM: CHEST  2 VIEW  COMPARISON:  No priors.  FINDINGS: Lung volumes are normal. No consolidative airspace disease. No pleural effusions. No pneumothorax. No pulmonary nodule or mass noted. Pulmonary vasculature and the cardiomediastinal silhouette are within normal limits. Atherosclerosis in the thoracic aorta.  IMPRESSION: 1.  No radiographic evidence of acute cardiopulmonary disease. 2. Atherosclerosis.   Electronically Signed   By: Trudie Reed  M.D.   On: 05/04/2013 16:51   Ct Abdomen Pelvis W Contrast  05/04/2013   CLINICAL DATA:  Abdominal pain, rectal bleeding, nausea, vomiting  EXAM: CT ABDOMEN AND PELVIS WITH CONTRAST  TECHNIQUE: Multidetector CT imaging of the abdomen and pelvis was performed using the standard protocol following bolus administration of intravenous contrast. Sagittal and coronal MPR images reconstructed from axial data set.  CONTRAST:  50mL OMNIPAQUE IOHEXOL 300 MG/ML SOLN, OMNIPAQUE IOHEXOL 300 MG/ML SOLN  COMPARISON:  None  FINDINGS: Minimal bibasilar atelectasis.  Nonobstructing 6 mm diameter 11 mm length calculus at lower pole left kidney.  Liver, spleen, pancreas, kidneys, and adrenal glands otherwise normal appearance.  Normal appendix.  Significant wall thickening of descending colon with pericolic infiltrative changes compatible with descending colitis.  Stomach, small bowel loops, and remainder of colon normal in appearance.  Unremarkable bladder and ureters.  Few scattered atherosclerotic calcifications.  No mass, adenopathy, or free air.  Tiny amount of dependent free pelvic fluid.  SI joint sclerosis bilaterally.  No acute osseous findings.  IMPRESSION: Descending colitis; differential diagnosis includes infection, inflammatory bowel disease, less likely ischemia.  Nonobstructing 6 x 11 mm calculus at lower pole left kidney.  Findings discussed with Dr. Clarene Duke on 05/04/2013 at 1705 hr.   Electronically Signed   By: Ulyses Southward M.D.   On: 05/04/2013 17:10    Microbiology: No results found for this or any previous visit (from the past 240 hour(s)).   Labs: Basic Metabolic Panel:  Recent Labs Lab 05/04/13 1509 05/05/13 0520 05/06/13 0512  NA 139 138 137  K 3.0* 3.3* 3.7  CL 101 103 102  CO2 26 27 27   GLUCOSE 104* 101* 95  BUN 9 8 11   CREATININE 0.68 0.78 0.78  CALCIUM 9.7 8.8 9.3   Liver Function Tests:  Recent Labs Lab 05/04/13 1509  AST 22  ALT 25  ALKPHOS 84  BILITOT 0.3  PROT 7.8   ALBUMIN 3.8    Recent Labs Lab 05/04/13 1509  LIPASE 21   No results found for this basename: AMMONIA,  in the last 168 hours CBC:  Recent Labs Lab 05/04/13 1509 05/05/13 0520  WBC 12.7* 10.1  NEUTROABS 8.3*  --   HGB 14.6 12.7  HCT 43.3 38.8  MCV 88.9 90.2  PLT 212 193   Cardiac Enzymes: No results found for this basename: CKTOTAL, CKMB, CKMBINDEX, TROPONINI,  in the last 168 hours BNP: BNP (last 3 results) No results found for this basename: PROBNP,  in the last 8760 hours CBG:  Recent Labs Lab 05/05/13 1129 05/05/13 1634 05/05/13 2115 05/06/13 0729 05/06/13 1113  GLUCAP 91 72 102* 91 97       Signed:  Tryniti Laatsch  [facility] 05/06/2013, 2:03 PM

## 2013-05-06 NOTE — Progress Notes (Signed)
IV removed, site WNL.  Pt given d/c instructions and new prescriptions.  Discussed home care with patient and discussed home medications (importance of taking antibiotics as prescribed), patient verbalizes understanding, teachback completed. F/U appointment in place, pt states they will keep appointment. Pt is stable at this time. Pt taken to main entrance in wheelchair by staff member.

## 2013-05-10 ENCOUNTER — Ambulatory Visit: Payer: Managed Care, Other (non HMO) | Admitting: Gastroenterology

## 2013-05-24 ENCOUNTER — Other Ambulatory Visit (HOSPITAL_COMMUNITY): Payer: Self-pay | Admitting: *Deleted

## 2013-05-24 DIAGNOSIS — Z139 Encounter for screening, unspecified: Secondary | ICD-10-CM

## 2013-05-25 ENCOUNTER — Ambulatory Visit: Payer: 59 | Admitting: Gastroenterology

## 2013-05-28 ENCOUNTER — Ambulatory Visit: Payer: 59 | Admitting: Gastroenterology

## 2013-06-10 ENCOUNTER — Ambulatory Visit (HOSPITAL_COMMUNITY)
Admission: RE | Admit: 2013-06-10 | Discharge: 2013-06-10 | Disposition: A | Discharge status: Home / Self Care | Payer: 59 | Source: Ambulatory Visit | Attending: Family Medicine | Admitting: Family Medicine

## 2013-06-10 DIAGNOSIS — Z1231 Encounter for screening mammogram for malignant neoplasm of breast: Secondary | ICD-10-CM | POA: Insufficient documentation

## 2013-06-10 DIAGNOSIS — Z139 Encounter for screening, unspecified: Secondary | ICD-10-CM

## 2013-06-11 ENCOUNTER — Ambulatory Visit (INDEPENDENT_AMBULATORY_CARE_PROVIDER_SITE_OTHER): Payer: 59 | Admitting: Gastroenterology

## 2013-06-11 ENCOUNTER — Encounter (INDEPENDENT_AMBULATORY_CARE_PROVIDER_SITE_OTHER): Payer: Self-pay

## 2013-06-11 ENCOUNTER — Encounter: Payer: Self-pay | Admitting: Gastroenterology

## 2013-06-11 VITALS — BP 154/81 | HR 72 | Temp 98.1°F | Wt 193.6 lb

## 2013-06-11 DIAGNOSIS — Z860100 Personal history of colon polyps, unspecified: Secondary | ICD-10-CM | POA: Insufficient documentation

## 2013-06-11 MED ORDER — PEG 3350-KCL-NA BICARB-NACL 420 G PO SOLR: 4000.0000 mL | ORAL | Status: DC

## 2013-06-11 NOTE — Progress Notes (Signed)
Primary Care Physician:  SELVIDGE,WILLIAM M, MD  Primary Gastroenterologist:  Michael Rourk, MD   Chief Complaint  Patient presents with  . Colonoscopy    HPI:  Colleen Dixon is a 62 y.o. female  here to schedule surveillance colonoscopy for history of colon polyps. Recently hospitalized with acute colitis thought to be infectious. Hospitalized from October 7-9. We were not consult it. She did not have stool studies. Treated empirically with Cipro and Flagyl and improved. States her symptoms started with acute onset abdominal pain, diarrhea, bloody mucousy stools. No ill contacts or prior antibiotic use.   Feels better. Stools are back to normal. No melena, brbpr. Appetite normal. No heartburn. No n/v.   Current Outpatient Prescriptions  Medication Sig Dispense Refill  . albuterol (PROVENTIL HFA;VENTOLIN HFA) 108 (90 BASE) MCG/ACT inhaler Inhale 2 puffs into the lungs every 6 (six) hours as needed for wheezing or shortness of breath.      . budesonide (PULMICORT) 180 MCG/ACT inhaler Inhale 1 puff into the lungs 2 (two) times daily.      . Calcium Carbonate-Vitamin D (CALTRATE 600+D PO) Take 2 tablets by mouth daily.      . Camphor-Menthol-Methyl Sal (SALONPAS) 1.2-5.7-6.3 % PTCH Apply 1 patch topically daily as needed (for right shoulder pain).      . cetirizine (ZYRTEC) 10 MG tablet Take 10 mg by mouth at bedtime.      . fluticasone (FLONASE) 50 MCG/ACT nasal spray Place 1 spray into the nose daily.      . lisinopril-hydrochlorothiazide (PRINZIDE,ZESTORETIC) 20-25 MG per tablet Take 1 tablet by mouth daily.      . metFORMIN (GLUCOPHAGE) 500 MG tablet Take 500 mg by mouth daily.      . montelukast (SINGULAIR) 10 MG tablet Take 10 mg by mouth at bedtime.      . naphazoline (NAPHCON) 0.1 % ophthalmic solution Place 1 drop into both eyes 4 (four) times daily as needed (for allergy eye relief).      . pravastatin (PRAVACHOL) 20 MG tablet Take 20 mg by mouth at bedtime.      . traMADol  (ULTRAM) 50 MG tablet Take 50 mg by mouth every 6 (six) hours as needed for pain.       No current facility-administered medications for this visit.    Allergies as of 06/11/2013 - Review Complete 06/11/2013  Allergen Reaction Noted  . Aspirin Nausea Only 05/04/2013    Past Medical History  Diagnosis Date  . Diabetes mellitus without complication   . Hypertension   . Shoulder pain   . Asthma   . Acute colitis 04/2013    hospitalized. treated with cipro/flagyl    Past Surgical History  Procedure Laterality Date  . Rotator cuff repair    . Fibroids removed    . Abdominal hysterectomy      partial  . Tonsillectomy    . Colonoscopy  02/29/2008    RMR:diminutive rectal polyp s/p bx and ablation/diminutive cecal polyps    Family History  Problem Relation Age of Onset  . Cancer Mother     History   Social History  . Marital Status: Married    Spouse Name: N/A    Number of Children: N/A  . Years of Education: N/A   Occupational History  . Not on file.   Social History Main Topics  . Smoking status: Former Smoker  . Smokeless tobacco: Never Used  . Alcohol Use: 0.6 oz/week    1 Glasses of wine per   week     Comment: socially  . Drug Use: No  . Sexual Activity: Not on file   Other Topics Concern  . Not on file   Social History Narrative  . No narrative on file      ROS:  General: Negative for anorexia, weight loss, fever, chills, fatigue, weakness. Eyes: Negative for vision changes.  ENT: Negative for hoarseness, difficulty swallowing , nasal congestion. CV: Negative for chest pain, angina, palpitations, dyspnea on exertion, peripheral edema.  Respiratory: Negative for dyspnea at rest, dyspnea on exertion, cough, sputum, wheezing.  GI: See history of present illness. GU:  Negative for dysuria, hematuria, urinary incontinence, urinary frequency, nocturnal urination.  MS: Negative for joint pain, low back pain.  Derm: Negative for rash or itching.  Neuro:  Negative for weakness, abnormal sensation, seizure, frequent headaches, memory loss, confusion.  Psych: Negative for anxiety, depression, suicidal ideation, hallucinations.  Endo: Negative for unusual weight change.  Heme: Negative for bruising or bleeding. Allergy: Negative for rash or hives.    Physical Examination:  BP 154/81  Pulse 72  Temp(Src) 98.1 F (36.7 C) (Oral)  Wt 193 lb 9.6 oz (87.816 kg)   General: Well-nourished, well-developed in no acute distress.  Head: Normocephalic, atraumatic.   Eyes: Conjunctiva pink, no icterus. Mouth: Oropharyngeal mucosa moist and pink , no lesions erythema or exudate. Neck: Supple without thyromegaly, masses, or lymphadenopathy.  Lungs: Clear to auscultation bilaterally.  Heart: Regular rate and rhythm, no murmurs rubs or gallops.  Abdomen: Bowel sounds are normal, nontender, nondistended, no hepatosplenomegaly or masses, no abdominal bruits or    hernia , no rebound or guarding.   Rectal: Deferred Extremities: No lower extremity edema. No clubbing or deformities.  Neuro: Alert and oriented x 4 , grossly normal neurologically.  Skin: Warm and dry, no rash or jaundice.   Psych: Alert and cooperative, normal mood and affect.  Labs: Lab Results  Component Value Date   WBC 10.1 05/05/2013   HGB 12.7 05/05/2013   HCT 38.8 05/05/2013   MCV 90.2 05/05/2013   PLT 193 05/05/2013   Lab Results  Component Value Date   CREATININE 0.78 05/06/2013   BUN 11 05/06/2013   NA 137 05/06/2013   K 3.7 05/06/2013   CL 102 05/06/2013   CO2 27 05/06/2013   Lab Results  Component Value Date   ALT 25 05/04/2013   AST 22 05/04/2013   ALKPHOS 84 05/04/2013   BILITOT 0.3 05/04/2013   Lab Results  Component Value Date   LIPASE 21 05/04/2013     CT A/P with contrast on 05/04/13 IMPRESSION:  Descending colitis; differential diagnosis includes infection,  inflammatory bowel disease, less likely ischemia.  Nonobstructing 6 x 11 mm calculus at lower pole left  kidney.  

## 2013-06-11 NOTE — Patient Instructions (Signed)
1. We have scheduled you for a colonoscopy. Please see separate instructions.  

## 2013-06-11 NOTE — Assessment & Plan Note (Signed)
62 year old lady with history of multiple colon polyps on prior two colonoscopies. She is due for surveillance study at this time. Recently had infectious colitis which is resolved. Plan for colonoscopy in 3-4 weeks.  I have discussed the risks, alternatives, benefits with regards to but not limited to the risk of reaction to medication, bleeding, infection, perforation and the patient is agreeable to proceed. Written consent to be obtained.

## 2013-06-14 NOTE — Progress Notes (Signed)
CC PCP 

## 2013-06-16 ENCOUNTER — Encounter (HOSPITAL_COMMUNITY): Payer: Self-pay | Admitting: Pharmacy Technician

## 2013-06-28 ENCOUNTER — Encounter (HOSPITAL_COMMUNITY)
Admission: RE | Disposition: A | Discharge status: Home / Self Care | Payer: Self-pay | Source: Ambulatory Visit | Attending: Internal Medicine

## 2013-06-28 ENCOUNTER — Encounter (HOSPITAL_COMMUNITY): Payer: Self-pay | Admitting: *Deleted

## 2013-06-28 ENCOUNTER — Ambulatory Visit (HOSPITAL_COMMUNITY)
Admission: RE | Admit: 2013-06-28 | Discharge: 2013-06-28 | Disposition: A | Discharge status: Home / Self Care | Payer: 59 | Source: Ambulatory Visit | Attending: Internal Medicine | Admitting: Internal Medicine

## 2013-06-28 DIAGNOSIS — E119 Type 2 diabetes mellitus without complications: Secondary | ICD-10-CM | POA: Insufficient documentation

## 2013-06-28 DIAGNOSIS — Z860100 Personal history of colon polyps, unspecified: Secondary | ICD-10-CM | POA: Insufficient documentation

## 2013-06-28 DIAGNOSIS — I10 Essential (primary) hypertension: Secondary | ICD-10-CM | POA: Insufficient documentation

## 2013-06-28 DIAGNOSIS — D126 Benign neoplasm of colon, unspecified: Secondary | ICD-10-CM | POA: Insufficient documentation

## 2013-06-28 DIAGNOSIS — Z01812 Encounter for preprocedural laboratory examination: Secondary | ICD-10-CM | POA: Insufficient documentation

## 2013-06-28 DIAGNOSIS — Z1211 Encounter for screening for malignant neoplasm of colon: Secondary | ICD-10-CM

## 2013-06-28 HISTORY — PX: COLONOSCOPY: SHX5424

## 2013-06-28 LAB — GLUCOSE, CAPILLARY: Glucose-Capillary: 91 mg/dL (ref 70–99)

## 2013-06-28 SURGERY — COLONOSCOPY
Anesthesia: Moderate Sedation

## 2013-06-28 MED ORDER — STERILE WATER FOR IRRIGATION IR SOLN
Status: DC | PRN
  Administered 2013-06-28: 09:00:00

## 2013-06-28 MED ORDER — ONDANSETRON HCL 4 MG/2ML IJ SOLN
INTRAMUSCULAR | Status: DC | PRN
  Administered 2013-06-28: 4 mg via INTRAVENOUS

## 2013-06-28 MED ORDER — ONDANSETRON HCL 4 MG/2ML IJ SOLN
INTRAMUSCULAR | Status: AC
  Filled 2013-06-28: qty 2

## 2013-06-28 MED ORDER — MIDAZOLAM HCL 5 MG/5ML IJ SOLN
INTRAMUSCULAR | Status: AC
  Filled 2013-06-28: qty 10

## 2013-06-28 MED ORDER — MEPERIDINE HCL 100 MG/ML IJ SOLN
INTRAMUSCULAR | Status: DC | PRN
  Administered 2013-06-28: 25 mg via INTRAVENOUS
  Administered 2013-06-28: 50 mg via INTRAVENOUS

## 2013-06-28 MED ORDER — SODIUM CHLORIDE 0.9 % IV SOLN
INTRAVENOUS | Status: DC
  Administered 2013-06-28: 08:00:00 via INTRAVENOUS

## 2013-06-28 MED ORDER — MEPERIDINE HCL 100 MG/ML IJ SOLN
INTRAMUSCULAR | Status: AC
  Filled 2013-06-28: qty 2

## 2013-06-28 MED ORDER — MIDAZOLAM HCL 5 MG/5ML IJ SOLN
INTRAMUSCULAR | Status: DC | PRN
  Administered 2013-06-28 (×2): 1 mg via INTRAVENOUS
  Administered 2013-06-28: 2 mg via INTRAVENOUS
  Administered 2013-06-28 (×2): 1 mg via INTRAVENOUS

## 2013-06-28 NOTE — Interval H&P Note (Signed)
History and Physical Interval Note:  06/28/2013 8:51 AM  Colleen Dixon  has presented today for surgery, with the diagnosis of HISTORY OF COLON POLYPS  The various methods of treatment have been discussed with the patient and family. After consideration of risks, benefits and other options for treatment, the patient has consented to  Procedure(s) with comments: COLONOSCOPY (N/A) - 8:30 as a surgical intervention .  The patient's history has been reviewed, patient examined, no change in status, stable for surgery.  I have reviewed the patient's chart and labs.  Questions were answered to the patient's satisfaction.     Recent gastroenteritis-resolved. GI symptoms back to baseline. Surveillance colonoscopy per plan.The risks, benefits, limitations, alternatives and imponderables have been reviewed with the patient. Questions have been answered. All parties are agreeable.   Eula Listen

## 2013-06-28 NOTE — H&P (View-Only) (Signed)
Primary Care Physician:  Arlyss Queen, MD  Primary Gastroenterologist:  Roetta Sessions, MD   Chief Complaint  Patient presents with  . Colonoscopy    HPI:  Colleen Dixon is a 62 y.o. female  here to schedule surveillance colonoscopy for history of colon polyps. Recently hospitalized with acute colitis thought to be infectious. Hospitalized from October 7-9. We were not consult it. She did not have stool studies. Treated empirically with Cipro and Flagyl and improved. States her symptoms started with acute onset abdominal pain, diarrhea, bloody mucousy stools. No ill contacts or prior antibiotic use.   Feels better. Stools are back to normal. No melena, brbpr. Appetite normal. No heartburn. No n/v.   Current Outpatient Prescriptions  Medication Sig Dispense Refill  . albuterol (PROVENTIL HFA;VENTOLIN HFA) 108 (90 BASE) MCG/ACT inhaler Inhale 2 puffs into the lungs every 6 (six) hours as needed for wheezing or shortness of breath.      . budesonide (PULMICORT) 180 MCG/ACT inhaler Inhale 1 puff into the lungs 2 (two) times daily.      . Calcium Carbonate-Vitamin D (CALTRATE 600+D PO) Take 2 tablets by mouth daily.      . Camphor-Menthol-Methyl Sal (SALONPAS) 1.2-5.7-6.3 % PTCH Apply 1 patch topically daily as needed (for right shoulder pain).      . cetirizine (ZYRTEC) 10 MG tablet Take 10 mg by mouth at bedtime.      . fluticasone (FLONASE) 50 MCG/ACT nasal spray Place 1 spray into the nose daily.      Marland Kitchen lisinopril-hydrochlorothiazide (PRINZIDE,ZESTORETIC) 20-25 MG per tablet Take 1 tablet by mouth daily.      . metFORMIN (GLUCOPHAGE) 500 MG tablet Take 500 mg by mouth daily.      . montelukast (SINGULAIR) 10 MG tablet Take 10 mg by mouth at bedtime.      . naphazoline (NAPHCON) 0.1 % ophthalmic solution Place 1 drop into both eyes 4 (four) times daily as needed (for allergy eye relief).      . pravastatin (PRAVACHOL) 20 MG tablet Take 20 mg by mouth at bedtime.      . traMADol  (ULTRAM) 50 MG tablet Take 50 mg by mouth every 6 (six) hours as needed for pain.       No current facility-administered medications for this visit.    Allergies as of 06/11/2013 - Review Complete 06/11/2013  Allergen Reaction Noted  . Aspirin Nausea Only 05/04/2013    Past Medical History  Diagnosis Date  . Diabetes mellitus without complication   . Hypertension   . Shoulder pain   . Asthma   . Acute colitis 04/2013    hospitalized. treated with cipro/flagyl    Past Surgical History  Procedure Laterality Date  . Rotator cuff repair    . Fibroids removed    . Abdominal hysterectomy      partial  . Tonsillectomy    . Colonoscopy  02/29/2008    ZOX:[id number] rectal polyp s/p bx and ablation/diminutive cecal polyps    Family History  Problem Relation Age of Onset  . Cancer Mother     History   Social History  . Marital Status: Married    Spouse Name: N/A    Number of Children: N/A  . Years of Education: N/A   Occupational History  . Not on file.   Social History Main Topics  . Smoking status: Former Games developer  . Smokeless tobacco: Never Used  . Alcohol Use: 0.6 oz/week    1 Glasses of wine per  week     Comment: socially  . Drug Use: No  . Sexual Activity: Not on file   Other Topics Concern  . Not on file   Social History Narrative  . No narrative on file      ROS:  General: Negative for anorexia, weight loss, fever, chills, fatigue, weakness. Eyes: Negative for vision changes.  ENT: Negative for hoarseness, difficulty swallowing , nasal congestion. CV: Negative for chest pain, angina, palpitations, dyspnea on exertion, peripheral edema.  Respiratory: Negative for dyspnea at rest, dyspnea on exertion, cough, sputum, wheezing.  GI: See history of present illness. GU:  Negative for dysuria, hematuria, urinary incontinence, urinary frequency, nocturnal urination.  MS: Negative for joint pain, low back pain.  Derm: Negative for rash or itching.  Neuro:  Negative for weakness, abnormal sensation, seizure, frequent headaches, memory loss, confusion.  Psych: Negative for anxiety, depression, suicidal ideation, hallucinations.  Endo: Negative for unusual weight change.  Heme: Negative for bruising or bleeding. Allergy: Negative for rash or hives.    Physical Examination:  BP 154/81  Pulse 72  Temp(Src) 98.1 F (36.7 C) (Oral)  Wt 193 lb 9.6 oz (87.816 kg)   General: Well-nourished, well-developed in no acute distress.  Head: Normocephalic, atraumatic.   Eyes: Conjunctiva pink, no icterus. Mouth: Oropharyngeal mucosa moist and pink , no lesions erythema or exudate. Neck: Supple without thyromegaly, masses, or lymphadenopathy.  Lungs: Clear to auscultation bilaterally.  Heart: Regular rate and rhythm, no murmurs rubs or gallops.  Abdomen: Bowel sounds are normal, nontender, nondistended, no hepatosplenomegaly or masses, no abdominal bruits or    hernia , no rebound or guarding.   Rectal: Deferred Extremities: No lower extremity edema. No clubbing or deformities.  Neuro: Alert and oriented x 4 , grossly normal neurologically.  Skin: Warm and dry, no rash or jaundice.   Psych: Alert and cooperative, normal mood and affect.  Labs: Lab Results  Component Value Date   WBC 10.1 05/05/2013   HGB 12.7 05/05/2013   HCT 38.8 05/05/2013   MCV 90.2 05/05/2013   PLT 193 05/05/2013   Lab Results  Component Value Date   CREATININE 0.78 05/06/2013   BUN 11 05/06/2013   NA 137 05/06/2013   K 3.7 05/06/2013   CL 102 05/06/2013   CO2 27 05/06/2013   Lab Results  Component Value Date   ALT 25 05/04/2013   AST 22 05/04/2013   ALKPHOS 84 05/04/2013   BILITOT 0.3 05/04/2013   Lab Results  Component Value Date   LIPASE 21 05/04/2013     CT A/P with contrast on 05/04/13 IMPRESSION:  Descending colitis; differential diagnosis includes infection,  inflammatory bowel disease, less likely ischemia.  Nonobstructing 6 x 11 mm calculus at lower pole left  kidney.

## 2013-06-28 NOTE — Op Note (Signed)
[facility] [address] [place], [address]   COLONOSCOPY PROCEDURE REPORT  PATIENT: Colleen, Dixon  MR#:         [id number] BIRTHDATE: 11/01/1950 , 62  yrs. old GENDER: Female ENDOSCOPIST: R.  Roetta Sessions, MD FACP FACG REFERRED BY:  Karrie Doffing, M.D. PROCEDURE DATE:  06/28/2013 PROCEDURE:     Colonoscopy with biopsy  INDICATIONS: History colonic adenoma  INFORMED CONSENT:  The risks, benefits, alternatives and imponderables including but not limited to bleeding, perforation as well as the possibility of a missed lesion have been reviewed.  The potential for biopsy, lesion removal, etc. have also been discussed.  Questions have been answered.  All parties agreeable. Please see the history and physical in the medical record for more information.  MEDICATIONS: Versed 6 mg IV and Demerol 75 mg IV in divided doses. Zofran 4 mg IV  DESCRIPTION OF PROCEDURE:  After a digital rectal exam was performed, the EC-[id number] ([id number])  colonoscope was advanced from the anus through the rectum and colon to the area of the cecum, ileocecal valve and appendiceal orifice.  The cecum was deeply intubated.  These structures were well-seen and photographed for the record.  From the level of the cecum and ileocecal valve, the scope was slowly and cautiously withdrawn.  The mucosal surfaces were carefully surveyed utilizing scope tip deflection to facilitate fold flattening as needed.  The scope was pulled down into the rectum where a thorough examination including retroflexion was performed.    FINDINGS:  Adequate preparation. Normal rectum. Tortuous, redundant colon. A number of maneuvers had to be employed including changing the patient's position and external abdominal pressure to reach the cecum. The patient had (1) diminutive polyp in the base the cecum and (1) at the splenic flexure; otherwise, the remainder of the colonic mucosa appeared normal.  THERAPEUTIC  / DIAGNOSTIC MANEUVERS PERFORMED:  The above-mentioned polyps were cold biopsied/removed  COMPLICATIONS: None  CECAL WITHDRAWAL TIME:  14 minutes  IMPRESSION:  Colonic polyps-removed as described above  RECOMMENDATIONS: Followup on pathology  _______________________________ eSigned:  R. Roetta Sessions, MD FACP [facility] 06/28/2013 9:44 AM   CC:

## 2013-06-29 ENCOUNTER — Encounter: Payer: Self-pay | Admitting: Internal Medicine

## 2013-07-02 ENCOUNTER — Encounter (HOSPITAL_COMMUNITY): Payer: Self-pay | Admitting: Internal Medicine

## 2014-09-13 ENCOUNTER — Other Ambulatory Visit (HOSPITAL_COMMUNITY): Payer: Self-pay | Admitting: *Deleted

## 2014-09-13 DIAGNOSIS — Z1231 Encounter for screening mammogram for malignant neoplasm of breast: Secondary | ICD-10-CM

## 2014-09-19 ENCOUNTER — Other Ambulatory Visit (HOSPITAL_COMMUNITY): Payer: Self-pay | Admitting: *Deleted

## 2014-09-19 DIAGNOSIS — Z1231 Encounter for screening mammogram for malignant neoplasm of breast: Secondary | ICD-10-CM

## 2014-09-20 ENCOUNTER — Other Ambulatory Visit: Payer: Self-pay | Admitting: Family Medicine

## 2014-09-20 ENCOUNTER — Other Ambulatory Visit (HOSPITAL_COMMUNITY): Payer: Self-pay | Admitting: Family Medicine

## 2014-09-20 DIAGNOSIS — Z1231 Encounter for screening mammogram for malignant neoplasm of breast: Secondary | ICD-10-CM

## 2014-09-30 ENCOUNTER — Ambulatory Visit (HOSPITAL_COMMUNITY)
Admission: RE | Admit: 2014-09-30 | Discharge: 2014-09-30 | Disposition: A | Discharge status: Home / Self Care | Payer: BLUE CROSS/BLUE SHIELD | Source: Ambulatory Visit | Attending: Family Medicine | Admitting: Family Medicine

## 2014-09-30 DIAGNOSIS — Z1231 Encounter for screening mammogram for malignant neoplasm of breast: Secondary | ICD-10-CM

## 2015-01-30 ENCOUNTER — Emergency Department (HOSPITAL_COMMUNITY): Payer: BLUE CROSS/BLUE SHIELD

## 2015-01-30 ENCOUNTER — Emergency Department (HOSPITAL_COMMUNITY): Payer: BLUE CROSS/BLUE SHIELD | Admitting: Anesthesiology

## 2015-01-30 ENCOUNTER — Encounter (HOSPITAL_COMMUNITY): Payer: Self-pay | Admitting: Emergency Medicine

## 2015-01-30 ENCOUNTER — Inpatient Hospital Stay (HOSPITAL_COMMUNITY)
Admission: EM | Admit: 2015-01-30 | Discharge: 2015-02-03 | DRG: 693 | Disposition: A | Discharge status: Home / Self Care | Payer: BLUE CROSS/BLUE SHIELD | Attending: Internal Medicine | Admitting: Internal Medicine

## 2015-01-30 ENCOUNTER — Encounter (HOSPITAL_COMMUNITY)
Admission: EM | Disposition: A | Discharge status: Home / Self Care | Payer: Self-pay | Source: Home / Self Care | Attending: Internal Medicine

## 2015-01-30 DIAGNOSIS — N1330 Unspecified hydronephrosis: Secondary | ICD-10-CM | POA: Diagnosis present

## 2015-01-30 DIAGNOSIS — E876 Hypokalemia: Secondary | ICD-10-CM | POA: Diagnosis present

## 2015-01-30 DIAGNOSIS — N200 Calculus of kidney: Secondary | ICD-10-CM

## 2015-01-30 DIAGNOSIS — N10 Acute tubulo-interstitial nephritis: Secondary | ICD-10-CM | POA: Diagnosis not present

## 2015-01-30 DIAGNOSIS — N390 Urinary tract infection, site not specified: Secondary | ICD-10-CM | POA: Diagnosis present

## 2015-01-30 DIAGNOSIS — E119 Type 2 diabetes mellitus without complications: Secondary | ICD-10-CM | POA: Diagnosis present

## 2015-01-30 DIAGNOSIS — E669 Obesity, unspecified: Secondary | ICD-10-CM | POA: Diagnosis present

## 2015-01-30 DIAGNOSIS — A419 Sepsis, unspecified organism: Secondary | ICD-10-CM | POA: Diagnosis not present

## 2015-01-30 DIAGNOSIS — I10 Essential (primary) hypertension: Secondary | ICD-10-CM | POA: Diagnosis present

## 2015-01-30 DIAGNOSIS — Z87891 Personal history of nicotine dependence: Secondary | ICD-10-CM

## 2015-01-30 DIAGNOSIS — R1032 Left lower quadrant pain: Secondary | ICD-10-CM | POA: Diagnosis present

## 2015-01-30 DIAGNOSIS — R112 Nausea with vomiting, unspecified: Secondary | ICD-10-CM | POA: Diagnosis not present

## 2015-01-30 DIAGNOSIS — N132 Hydronephrosis with renal and ureteral calculous obstruction: Secondary | ICD-10-CM | POA: Diagnosis present

## 2015-01-30 DIAGNOSIS — Z01811 Encounter for preprocedural respiratory examination: Secondary | ICD-10-CM

## 2015-01-30 DIAGNOSIS — E1169 Type 2 diabetes mellitus with other specified complication: Secondary | ICD-10-CM

## 2015-01-30 DIAGNOSIS — J45909 Unspecified asthma, uncomplicated: Secondary | ICD-10-CM | POA: Diagnosis present

## 2015-01-30 DIAGNOSIS — B964 Proteus (mirabilis) (morganii) as the cause of diseases classified elsewhere: Secondary | ICD-10-CM | POA: Diagnosis present

## 2015-01-30 DIAGNOSIS — N3000 Acute cystitis without hematuria: Secondary | ICD-10-CM | POA: Diagnosis not present

## 2015-01-30 DIAGNOSIS — N1339 Other hydronephrosis: Secondary | ICD-10-CM | POA: Diagnosis not present

## 2015-01-30 HISTORY — PX: CYSTOSCOPY WITH STENT PLACEMENT: SHX5790

## 2015-01-30 LAB — CBC WITH DIFFERENTIAL/PLATELET
BASOS PCT: 0 % (ref 0–1)
Basophils Absolute: 0 10*3/uL (ref 0.0–0.1)
EOS PCT: 0 % (ref 0–5)
Eosinophils Absolute: 0 10*3/uL (ref 0.0–0.7)
HCT: 41.7 % (ref 36.0–46.0)
Hemoglobin: 14 g/dL (ref 12.0–15.0)
Lymphocytes Relative: 5 % — ABNORMAL LOW (ref 12–46)
Lymphs Abs: 1.2 10*3/uL (ref 0.7–4.0)
MCH: 29.9 pg (ref 26.0–34.0)
MCHC: 33.6 g/dL (ref 30.0–36.0)
MCV: 89.1 fL (ref 78.0–100.0)
MONO ABS: 1.4 10*3/uL — AB (ref 0.1–1.0)
MONOS PCT: 6 % (ref 3–12)
Neutro Abs: 22.9 10*3/uL — ABNORMAL HIGH (ref 1.7–7.7)
Neutrophils Relative %: 90 % — ABNORMAL HIGH (ref 43–77)
PLATELETS: 222 10*3/uL (ref 150–400)
RBC: 4.68 MIL/uL (ref 3.87–5.11)
RDW: 13.7 % (ref 11.5–15.5)
WBC: 25.5 10*3/uL — ABNORMAL HIGH (ref 4.0–10.5)

## 2015-01-30 LAB — COMPREHENSIVE METABOLIC PANEL
ALBUMIN: 3.8 g/dL (ref 3.5–5.0)
ALT: 23 U/L (ref 14–54)
ANION GAP: 11 (ref 5–15)
AST: 24 U/L (ref 15–41)
Alkaline Phosphatase: 92 U/L (ref 38–126)
BILIRUBIN TOTAL: 1.1 mg/dL (ref 0.3–1.2)
BUN: 20 mg/dL (ref 6–20)
CHLORIDE: 99 mmol/L — AB (ref 101–111)
CO2: 25 mmol/L (ref 22–32)
Calcium: 9.6 mg/dL (ref 8.9–10.3)
Creatinine, Ser: 1.3 mg/dL — ABNORMAL HIGH (ref 0.44–1.00)
GFR calc Af Amer: 50 mL/min — ABNORMAL LOW (ref >60)
GFR, EST NON AFRICAN AMERICAN: 43 mL/min — AB (ref >60)
Glucose, Bld: 131 mg/dL — ABNORMAL HIGH (ref 65–99)
Potassium: 3.3 mmol/L — ABNORMAL LOW (ref 3.5–5.1)
Sodium: 135 mmol/L (ref 135–145)
Total Protein: 7.6 g/dL (ref 6.5–8.1)

## 2015-01-30 LAB — URINALYSIS, ROUTINE W REFLEX MICROSCOPIC
BILIRUBIN URINE: NEGATIVE
Glucose, UA: NEGATIVE mg/dL
Ketones, ur: NEGATIVE mg/dL
NITRITE: NEGATIVE
Protein, ur: NEGATIVE mg/dL
SPECIFIC GRAVITY, URINE: 1.01 (ref 1.005–1.030)
Urobilinogen, UA: 0.2 mg/dL (ref 0.0–1.0)
pH: 6 (ref 5.0–8.0)

## 2015-01-30 LAB — URINE MICROSCOPIC-ADD ON

## 2015-01-30 LAB — LIPASE, BLOOD: Lipase: 15 U/L — ABNORMAL LOW (ref 22–51)

## 2015-01-30 SURGERY — CYSTOSCOPY, WITH STENT INSERTION
Anesthesia: Monitor Anesthesia Care | Laterality: Left

## 2015-01-30 MED ORDER — SODIUM CHLORIDE 0.9 % IV SOLN
INTRAVENOUS | Status: DC
  Administered 2015-01-30 – 2015-02-03 (×6): via INTRAVENOUS

## 2015-01-30 MED ORDER — MIDAZOLAM HCL 2 MG/2ML IJ SOLN
INTRAMUSCULAR | Status: AC
  Filled 2015-01-30: qty 2

## 2015-01-30 MED ORDER — FENTANYL CITRATE (PF) 100 MCG/2ML IJ SOLN
INTRAMUSCULAR | Status: DC | PRN
  Administered 2015-01-30: 50 ug via INTRAVENOUS
  Administered 2015-01-30 (×2): 25 ug via INTRAVENOUS

## 2015-01-30 MED ORDER — MORPHINE SULFATE 4 MG/ML IJ SOLN
4.0000 mg | Freq: Once | INTRAMUSCULAR | Status: AC
Start: 2015-01-30 — End: 2015-01-30
  Administered 2015-01-30: 4 mg via INTRAVENOUS
  Filled 2015-01-30: qty 1

## 2015-01-30 MED ORDER — INSULIN ASPART 100 UNIT/ML ~~LOC~~ SOLN
0.0000 [IU] | Freq: Three times a day (TID) | SUBCUTANEOUS | Status: DC
  Administered 2015-01-31: 3 [IU] via SUBCUTANEOUS

## 2015-01-30 MED ORDER — LIDOCAINE HCL (PF) 1 % IJ SOLN
INTRAMUSCULAR | Status: AC
  Filled 2015-01-30: qty 5

## 2015-01-30 MED ORDER — MONTELUKAST SODIUM 10 MG PO TABS
10.0000 mg | ORAL_TABLET | Freq: Every day | ORAL | Status: DC
  Administered 2015-01-30 – 2015-02-02 (×4): 10 mg via ORAL
  Filled 2015-01-30 (×4): qty 1

## 2015-01-30 MED ORDER — BUDESONIDE 0.25 MG/2ML IN SUSP
RESPIRATORY_TRACT | Status: AC
  Filled 2015-01-30: qty 2

## 2015-01-30 MED ORDER — CEFTRIAXONE SODIUM 1 G IJ SOLR
1.0000 g | Freq: Once | INTRAMUSCULAR | Status: AC
  Administered 2015-01-30: 1 g via INTRAVENOUS
  Filled 2015-01-30: qty 10

## 2015-01-30 MED ORDER — LISINOPRIL 10 MG PO TABS
20.0000 mg | ORAL_TABLET | Freq: Every day | ORAL | Status: DC
  Administered 2015-01-31 – 2015-02-03 (×4): 20 mg via ORAL
  Filled 2015-01-30 (×4): qty 2

## 2015-01-30 MED ORDER — ONDANSETRON HCL 4 MG/2ML IJ SOLN
4.0000 mg | Freq: Once | INTRAMUSCULAR | Status: AC
  Administered 2015-01-30: 4 mg via INTRAVENOUS
  Filled 2015-01-30: qty 2

## 2015-01-30 MED ORDER — NAPHAZOLINE HCL 0.1 % OP SOLN
1.0000 [drp] | Freq: Four times a day (QID) | OPHTHALMIC | Status: DC | PRN
  Filled 2015-01-30: qty 15

## 2015-01-30 MED ORDER — FLUTICASONE PROPIONATE 50 MCG/ACT NA SUSP
NASAL | Status: AC
  Filled 2015-01-30: qty 16

## 2015-01-30 MED ORDER — LISINOPRIL-HYDROCHLOROTHIAZIDE 20-25 MG PO TABS: 1.0000 | ORAL_TABLET | Freq: Every day | ORAL | Status: DC

## 2015-01-30 MED ORDER — ONDANSETRON HCL 4 MG/2ML IJ SOLN
INTRAMUSCULAR | Status: AC
  Filled 2015-01-30: qty 2

## 2015-01-30 MED ORDER — BUDESONIDE 0.25 MG/2ML IN SUSP
0.2500 mg | Freq: Two times a day (BID) | RESPIRATORY_TRACT | Status: DC
  Administered 2015-01-30 – 2015-02-03 (×8): 0.25 mg via RESPIRATORY_TRACT
  Filled 2015-01-30 (×14): qty 2

## 2015-01-30 MED ORDER — IOHEXOL 300 MG/ML  SOLN
50.0000 mL | Freq: Once | INTRAMUSCULAR | Status: AC | PRN
  Administered 2015-01-30: 50 mL via ORAL

## 2015-01-30 MED ORDER — ALBUTEROL SULFATE HFA 108 (90 BASE) MCG/ACT IN AERS
INHALATION_SPRAY | RESPIRATORY_TRACT | Status: AC
  Filled 2015-01-30: qty 6.7

## 2015-01-30 MED ORDER — DEXTROSE 5 % IV SOLN
1.0000 g | INTRAVENOUS | Status: DC
  Administered 2015-01-31: 1 g via INTRAVENOUS
  Filled 2015-01-30 (×2): qty 10

## 2015-01-30 MED ORDER — ONDANSETRON HCL 4 MG/2ML IJ SOLN
INTRAMUSCULAR | Status: DC | PRN
  Administered 2015-01-30: 4 mg via INTRAVENOUS

## 2015-01-30 MED ORDER — LIDOCAINE VISCOUS 2 % MT SOLN
OROMUCOSAL | Status: DC | PRN
  Administered 2015-01-30: 6 mL

## 2015-01-30 MED ORDER — TRAMADOL HCL 50 MG PO TABS
50.0000 mg | ORAL_TABLET | Freq: Four times a day (QID) | ORAL | Status: DC | PRN
  Administered 2015-01-31 – 2015-02-02 (×2): 50 mg via ORAL
  Filled 2015-01-30 (×2): qty 1

## 2015-01-30 MED ORDER — OXYBUTYNIN CHLORIDE 5 MG PO TABS: 5.0000 mg | ORAL_TABLET | Freq: Three times a day (TID) | ORAL | Status: DC | PRN

## 2015-01-30 MED ORDER — FENTANYL CITRATE (PF) 100 MCG/2ML IJ SOLN
INTRAMUSCULAR | Status: AC
  Filled 2015-01-30: qty 2

## 2015-01-30 MED ORDER — STERILE WATER FOR IRRIGATION IR SOLN
Status: DC | PRN
  Administered 2015-01-30: 3000 mL

## 2015-01-30 MED ORDER — ALBUTEROL SULFATE HFA 108 (90 BASE) MCG/ACT IN AERS
INHALATION_SPRAY | RESPIRATORY_TRACT | Status: DC | PRN
  Administered 2015-01-30: 2 via RESPIRATORY_TRACT

## 2015-01-30 MED ORDER — HYDROCHLOROTHIAZIDE 25 MG PO TABS
25.0000 mg | ORAL_TABLET | Freq: Every day | ORAL | Status: DC
  Administered 2015-01-31 – 2015-02-03 (×4): 25 mg via ORAL
  Filled 2015-01-30 (×4): qty 1

## 2015-01-30 MED ORDER — ENOXAPARIN SODIUM 40 MG/0.4ML ~~LOC~~ SOLN
40.0000 mg | SUBCUTANEOUS | Status: DC
  Administered 2015-01-30 – 2015-02-02 (×4): 40 mg via SUBCUTANEOUS
  Filled 2015-01-30 (×4): qty 0.4

## 2015-01-30 MED ORDER — PROPOFOL 10 MG/ML IV BOLUS
INTRAVENOUS | Status: AC
  Filled 2015-01-30: qty 20

## 2015-01-30 MED ORDER — INSULIN ASPART 100 UNIT/ML ~~LOC~~ SOLN: 0.0000 [IU] | Freq: Every day | SUBCUTANEOUS | Status: DC

## 2015-01-30 MED ORDER — ONDANSETRON HCL 4 MG PO TABS: 4.0000 mg | ORAL_TABLET | Freq: Four times a day (QID) | ORAL | Status: DC | PRN

## 2015-01-30 MED ORDER — SODIUM CHLORIDE 0.9 % IV SOLN
INTRAVENOUS | Status: DC | PRN
  Administered 2015-01-30: 18:00:00 via INTRAVENOUS

## 2015-01-30 MED ORDER — ACETAMINOPHEN 325 MG PO TABS
650.0000 mg | ORAL_TABLET | Freq: Four times a day (QID) | ORAL | Status: DC | PRN
  Administered 2015-01-30 – 2015-02-02 (×6): 650 mg via ORAL
  Filled 2015-01-30 (×6): qty 2

## 2015-01-30 MED ORDER — FLUTICASONE PROPIONATE 50 MCG/ACT NA SUSP
1.0000 | Freq: Every day | NASAL | Status: DC
  Administered 2015-01-30 – 2015-02-03 (×5): 1 via NASAL
  Filled 2015-01-30: qty 16

## 2015-01-30 MED ORDER — PROPOFOL INFUSION 10 MG/ML OPTIME
INTRAVENOUS | Status: DC | PRN
  Administered 2015-01-30: 75 ug/kg/min via INTRAVENOUS

## 2015-01-30 MED ORDER — ALBUTEROL SULFATE (2.5 MG/3ML) 0.083% IN NEBU: 3.0000 mL | INHALATION_SOLUTION | Freq: Four times a day (QID) | RESPIRATORY_TRACT | Status: DC | PRN

## 2015-01-30 MED ORDER — MIDAZOLAM HCL 5 MG/5ML IJ SOLN
INTRAMUSCULAR | Status: DC | PRN
  Administered 2015-01-30: 2 mg via INTRAVENOUS

## 2015-01-30 MED ORDER — IOHEXOL 300 MG/ML  SOLN
100.0000 mL | Freq: Once | INTRAMUSCULAR | Status: AC | PRN
Start: 2015-01-30 — End: 2015-01-30
  Administered 2015-01-30: 100 mL via INTRAVENOUS

## 2015-01-30 MED ORDER — ONDANSETRON HCL 4 MG/2ML IJ SOLN: 4.0000 mg | Freq: Four times a day (QID) | INTRAMUSCULAR | Status: DC | PRN

## 2015-01-30 MED ORDER — LIDOCAINE HCL (CARDIAC) 10 MG/ML IV SOLN
INTRAVENOUS | Status: DC | PRN
Start: 2015-01-30 — End: 2015-01-30
  Administered 2015-01-30: 30 mg via INTRAVENOUS

## 2015-01-30 MED ORDER — BUDESONIDE 180 MCG/ACT IN AEPB
1.0000 | INHALATION_SPRAY | Freq: Two times a day (BID) | RESPIRATORY_TRACT | Status: DC
Start: 2015-01-30 — End: 2015-01-30

## 2015-01-30 MED ORDER — LORATADINE 10 MG PO TABS
10.0000 mg | ORAL_TABLET | Freq: Every day | ORAL | Status: DC
  Administered 2015-01-31 – 2015-02-03 (×4): 10 mg via ORAL
  Filled 2015-01-30 (×4): qty 1

## 2015-01-30 MED ORDER — LIDOCAINE HCL 2 % EX GEL
CUTANEOUS | Status: AC
  Filled 2015-01-30: qty 10

## 2015-01-30 SURGICAL SUPPLY — 15 items
BAG DRAIN URO TABLE W/ADPT NS (DRAPE) ×1 IMPLANT
BAG DRN 8 ADPR NS SKTRN CSTL (DRAPE) ×1
BAG HAMPER (MISCELLANEOUS) ×1 IMPLANT
CATH INTERMIT  6FR 70CM (CATHETERS) ×2 IMPLANT
GLOVE BIOGEL M 8.0 STRL (GLOVE) ×1 IMPLANT
GOWN STRL REUS W/ TWL LRG LVL3 (GOWN DISPOSABLE) IMPLANT
GOWN STRL REUS W/TWL LRG LVL3 (GOWN DISPOSABLE) ×2
GOWN STRL REUS W/TWL XL LVL3 (GOWN DISPOSABLE) ×1 IMPLANT
GUIDEWIRE STR DUAL SENSOR (WIRE) ×1 IMPLANT
KIT ROOM TURNOVER AP CYSTO (KITS) ×1 IMPLANT
PACK CYSTO (CUSTOM PROCEDURE TRAY) ×1 IMPLANT
PAD ARMBOARD 7.5X6 YLW CONV (MISCELLANEOUS) ×1 IMPLANT
STENT URET 6FRX24 CONTOUR (STENTS) ×1 IMPLANT
WATER STERILE IRR 1000ML POUR (IV SOLUTION) ×1 IMPLANT
WATER STERILE IRR 3000ML UROMA (IV SOLUTION) ×1 IMPLANT

## 2015-01-30 NOTE — Anesthesia Procedure Notes (Signed)
Procedure Name: MAC Date/Time: 01/30/2015 8:14 AM Performed by: Vista Deck Pre-anesthesia Checklist: Patient identified, Emergency Drugs available, Suction available, Timeout performed and Patient being monitored Patient Re-evaluated:Patient Re-evaluated prior to inductionOxygen Delivery Method: Non-rebreather mask

## 2015-01-30 NOTE — H&P (Signed)
[facility] History and Physical  Jamiracle Avants [place] QMV:[id number] DOB: 05/23/1951 DOA: 01/30/2015  Referring physician: Urology, Dr. Dorina Hoyer PCP: Christoper Fabian, MD   Chief Complaint: Dizziness. Abdominal pain.  HPI: Colleen Dixon is a 64 y.o. female  This is a 64 year old lady, diet controlled diabetic, hypertension, asthma, who presents with left lower quadrant abdominal pain that started 2 days ago and was worsening. She did not have any urinary complaints. She has had several episodes of vomiting associated with the abdominal pain. There is no diarrhea. Evaluation in the emergency room with CT scan of the abdomen and pelvis showed her to have left hydronephrosis with left obstructing urinary stone in the proximal left ureter. She was seen by urology and taken to the OR this evening and had Cystoscopy, placement of left double-J stent-24 cm x 6 French contour without string . The procedure went without complications and she is now postoperative on the medical floor. She is somewhat drowsy from the anesthesia. She does have a fever but does not appear to be septic/toxic.   Review of Systems:  Apart from symptoms above, all systems negative.  Past Medical History  Diagnosis Date  . Diabetes mellitus without complication   . Hypertension   . Shoulder pain   . Asthma   . Acute colitis 04/2013    hospitalized. treated with cipro/flagyl   Past Surgical History  Procedure Laterality Date  . Rotator cuff repair    . Fibroids removed    . Abdominal hysterectomy      partial  . Tonsillectomy    . Colonoscopy  02/29/2008    MWU:[id number] rectal polyp s/p bx and ablation/diminutive cecal polyps  . Colonoscopy N/A 06/28/2013    Procedure: COLONOSCOPY;  Surgeon: Daneil Dolin, MD;  [facility]: [facility];  Service: Endoscopy;  Laterality: N/A;  8:30   Social History:  reports that she has quit smoking. She has never used smokeless tobacco. She reports that she drinks about 0.6 oz  of alcohol per week. She reports that she does not use illicit drugs.  Allergies  Allergen Reactions  . Aspirin Nausea Only    Family History  Problem Relation Age of Onset  . Cancer Mother     Prior to Admission medications   Medication Sig Start Date End Date Taking? Authorizing Provider  albuterol (PROVENTIL HFA;VENTOLIN HFA) 108 (90 BASE) MCG/ACT inhaler Inhale 2 puffs into the lungs every 6 (six) hours as needed for wheezing or shortness of breath.   Yes Historical Provider, MD  budesonide (PULMICORT) 180 MCG/ACT inhaler Inhale 1 puff into the lungs 2 (two) times daily.   Yes Historical Provider, MD  Calcium Carbonate-Vitamin D (CALTRATE 600+D PO) Take 2 tablets by mouth daily.   Yes Historical Provider, MD  Camphor-Menthol-Methyl Sal (SALONPAS) 1.2-5.7-6.3 % PTCH Apply 1 patch topically daily as needed (for right shoulder pain).   Yes Historical Provider, MD  cetirizine (ZYRTEC) 10 MG tablet Take 10 mg by mouth at bedtime.   Yes Historical Provider, MD  fluticasone (FLONASE) 50 MCG/ACT nasal spray Place 1 spray into the nose daily.   Yes Historical Provider, MD  lisinopril-hydrochlorothiazide (PRINZIDE,ZESTORETIC) 20-25 MG per tablet Take 1 tablet by mouth daily.   Yes Historical Provider, MD  montelukast (SINGULAIR) 10 MG tablet Take 10 mg by mouth at bedtime.   Yes Historical Provider, MD  naphazoline (NAPHCON) 0.1 % ophthalmic solution Place 1 drop into both eyes 4 (four) times daily as needed (for allergy eye relief).  Yes Historical Provider, MD  traMADol (ULTRAM) 50 MG tablet Take 50 mg by mouth every 6 (six) hours as needed for pain.   Yes Historical Provider, MD   Physical Exam: Filed Vitals:   01/30/15 1900 01/30/15 1915 01/30/15 1930 01/30/15 2009  BP: 110/41 114/43 127/53 146/52  Pulse: 103 102 109 107  Temp:    102.8 F (39.3 C)  TempSrc:    Oral  Resp: 25 25 25 22   Height:      Weight:    82.509 kg (181 lb 14.4 oz)  SpO2: 100% 100% 99% 93%    Wt Readings  from Last 3 Encounters:  01/30/15 82.509 kg (181 lb 14.4 oz)  06/28/13 87.544 kg (193 lb)  06/11/13 87.816 kg (193 lb 9.6 oz)    General:  Appears drowsy from the anesthesia. Fever. Not toxic clinically. Hemodynamically stable. Eyes: PERRL, normal lids, irises & conjunctiva ENT: grossly normal hearing, lips & tongue Neck: no LAD, masses or thyromegaly Cardiovascular: RRR, no m/r/g. No LE edema. Telemetry: SR, no arrhythmias  Respiratory: CTA bilaterally, no w/r/r. Normal respiratory effort. Abdomen: soft, ntnd Skin: no rash or induration seen on limited exam Musculoskeletal: grossly normal tone BUE/BLE Psychiatric: grossly normal mood and affect, speech fluent and appropriate Neurologic: grossly non-focal.          Labs on Admission:  Basic Metabolic Panel:  Recent Labs Lab 01/30/15 1318  NA 135  K 3.3*  CL 99*  CO2 25  GLUCOSE 131*  BUN 20  CREATININE 1.30*  CALCIUM 9.6   Liver Function Tests:  Recent Labs Lab 01/30/15 1318  AST 24  ALT 23  ALKPHOS 92  BILITOT 1.1  PROT 7.6  ALBUMIN 3.8    Recent Labs Lab 01/30/15 1318  LIPASE 15*   No results for input(s): AMMONIA in the last 168 hours. CBC:  Recent Labs Lab 01/30/15 1318  WBC 25.5*  NEUTROABS 22.9*  HGB 14.0  HCT 41.7  MCV 89.1  PLT 222   Cardiac Enzymes: No results for input(s): CKTOTAL, CKMB, CKMBINDEX, TROPONINI in the last 168 hours.  BNP (last 3 results) No results for input(s): BNP in the last 8760 hours.  ProBNP (last 3 results) No results for input(s): PROBNP in the last 8760 hours.  CBG: No results for input(s): GLUCAP in the last 168 hours.  Radiological Exams on Admission: Ct Abdomen Pelvis W Contrast  01/30/2015   CLINICAL DATA:  Left lower quadrant pain.  EXAM: CT ABDOMEN AND PELVIS WITH CONTRAST  TECHNIQUE: Multidetector CT imaging of the abdomen and pelvis was performed using the standard protocol following bolus administration of intravenous contrast.  CONTRAST:  68mL  OMNIPAQUE IOHEXOL 300 MG/ML SOLN, 115mL OMNIPAQUE IOHEXOL 300 MG/ML SOLN  COMPARISON:  May 04, 2013  FINDINGS: There is left hydronephrosis with left perinephric due to obstruction by a 5.3 x 10 mm stone in the proximal left ureter. The right kidney is normal.  There is mild diffuse low density of the liver. There is no focal liver lesion. The spleen, pancreas, gallbladder, adrenal glands, and aorta are normal. There is no small bowel obstruction or diverticulitis. The appendix is normal.  The bladder is normal. The uterus is not seen. There is mild atelectasis of bilateral lungs. Degenerative changes of the spine are noted.  IMPRESSION: There is left hydronephrosis with left perinephric due to obstruction by a 5.3 x 10 mm stone in the proximal left ureter.   Electronically Signed   By: Mallie Darting.D.  On: 01/30/2015 15:52   Dg Chest Port 1 View  01/30/2015   CLINICAL DATA:  Left-sided pain and dizzy since Saturday.  EXAM: PORTABLE CHEST - 1 VIEW  COMPARISON:  May 04, 2013  FINDINGS: The mediastinal contour is normal. The heart size is enlarged. There is no focal infiltrate, pulmonary edema, or pleural effusion. The visualized skeletal structures are unremarkable.  IMPRESSION: No active cardiopulmonary disease.   Electronically Signed   By: Abelardo Diesel M.D.   On: 01/30/2015 17:25   Dg C-arm 1-60 Min-no Report  01/30/2015   CLINICAL DATA: infected left kidney stone   C-ARM 1-60 MINUTES  Fluoroscopy was utilized by the requesting physician.  No radiographic  interpretation.       Assessment/Plan   1. Left hydronephrosis, status post cystoscopy and placement of left double-J stent. This will be managed by urology and they will follow. 2. UTI. Intravenous Rocephin. 3. Diabetes, diet controlled. Sliding scale of insulin. 4. Hypertension. Stable.  Further recommendations will depend on patient's hospital progress.   Code Status: Full code.   DVT Prophylaxis: Lovenox.  Family  Communication: I briefly discussed the plan with the patient at the bedside. She was somewhat drowsy.   Disposition Plan: Home when medically stable.   Time spent: 45 minutes.  Doree Albee [facility] Pager [phone].

## 2015-01-30 NOTE — ED Notes (Signed)
CRNA at bedside.

## 2015-01-30 NOTE — ED Notes (Signed)
Patient states nausea and discomfort while trying to swallow contrast.

## 2015-01-30 NOTE — ED Notes (Signed)
Pt reports onset of dizziness on Sat night. Pt c/o pain in her L abd that radiates into her L side. Pt states she has been vomiting.

## 2015-01-30 NOTE — Anesthesia Postprocedure Evaluation (Signed)
  Anesthesia Post-op Note  Patient: Colleen Dixon  Procedure(s) Performed: Procedure(s): CYSTOSCOPY WITH LEFT DOUBLE J URETERAL STENT PLACEMENT (Left)  Patient Location: PACU  Anesthesia Type:MAC  Level of Consciousness: awake and patient cooperative  Airway and Oxygen Therapy: Patient Spontanous Breathing and Patient connected to nasal cannula oxygen  Post-op Pain: none  Post-op Assessment: Post-op Vital signs reviewed, Patient's Cardiovascular Status Stable, Respiratory Function Stable and Patent Airway              Post-op Vital Signs: Reviewed and stable    Complications: No apparent anesthesia complications

## 2015-01-30 NOTE — Anesthesia Preprocedure Evaluation (Signed)
Anesthesia Evaluation  Patient identified by MRN, date of birth, ID band Patient awake    Reviewed: Allergy & Precautions, NPO status , Patient's Chart, lab work & pertinent test results  Airway Mallampati: III  TM Distance: >3 FB Neck ROM: Full    Dental  (+) Edentulous Upper, Edentulous Lower   Pulmonary asthma , former smoker,  breath sounds clear to auscultation        Cardiovascular Exercise Tolerance: Good hypertension, Rhythm:Regular     Neuro/Psych    GI/Hepatic Patient received Oral Contrast Agents,Occasional reflux; no meds   Endo/Other  diabetes, Well Controlled  Renal/GU      Musculoskeletal   Abdominal   Peds  Hematology   Anesthesia Other Findings   Reproductive/Obstetrics                             Anesthesia Physical Anesthesia Plan  ASA: II and emergent  Anesthesia Plan: MAC   Post-op Pain Management:    Induction: Intravenous  Airway Management Planned: Simple Face Mask  Additional Equipment:   Intra-op Plan:   Post-operative Plan:   Informed Consent: I have reviewed the patients History and Physical, chart, labs and discussed the procedure including the risks, benefits and alternatives for the proposed anesthesia with the patient or authorized representative who has indicated his/her understanding and acceptance.     Plan Discussed with: Surgeon  Anesthesia Plan Comments:         Anesthesia Quick Evaluation

## 2015-01-30 NOTE — ED Provider Notes (Signed)
CSN: [id number]     Arrival date & time 01/30/15  1247 History   First MD Initiated Contact with Patient 01/30/15 1306     Chief Complaint  Patient presents with  . Dizziness  . Abdominal Pain     (Consider location/radiation/quality/duration/timing/severity/associated sxs/prior Treatment) HPI Comments: Patient is a 64 year old female with history of diabetes, hypertension, asthma. She presents for evaluation of left lower quadrant abdominal pain that started 2 days ago and is worsening. She denies any urinary complaints. She denies any diarrhea but does report several episodes of vomiting. She states she had a colonoscopy approximately one year ago that was unremarkable with the exception of polyp removal. This was done routinely.  Patient is a 64 y.o. female presenting with abdominal pain. The history is provided by the patient.  Abdominal Pain Pain location:  LLQ Pain quality: cramping   Pain radiates to:  LUQ Pain severity:  Moderate Onset quality:  Gradual Duration:  2 days Timing:  Constant Progression:  Worsening Chronicity:  New Relieved by:  Nothing Worsened by:  Nothing tried Ineffective treatments:  None tried Associated symptoms: vomiting   Associated symptoms: no constipation, no diarrhea, no dysuria and no fever     Past Medical History  Diagnosis Date  . Diabetes mellitus without complication   . Hypertension   . Shoulder pain   . Asthma   . Acute colitis 04/2013    hospitalized. treated with cipro/flagyl   Past Surgical History  Procedure Laterality Date  . Rotator cuff repair    . Fibroids removed    . Abdominal hysterectomy      partial  . Tonsillectomy    . Colonoscopy  02/29/2008    [id number] rectal polyp s/p bx and ablation/diminutive cecal polyps  . Colonoscopy N/A 06/28/2013    Procedure: COLONOSCOPY;  Surgeon: Daneil Dolin, MD;  Location: [facility];  Service: Endoscopy;  Laterality: N/A;  8:30   Family History  Problem Relation Age  of Onset  . Cancer Mother    History  Substance Use Topics  . Smoking status: Former Research scientist (life sciences)  . Smokeless tobacco: Never Used  . Alcohol Use: 0.6 oz/week    1 Glasses of wine per week     Comment: socially   OB History    Gravida Para Term Preterm AB TAB SAB Ectopic Multiple Living   2 2 2             Review of Systems  Constitutional: Negative for fever.  Gastrointestinal: Positive for vomiting and abdominal pain. Negative for diarrhea and constipation.  Genitourinary: Negative for dysuria.  All other systems reviewed and are negative.     Allergies  Aspirin  Home Medications   Prior to Admission medications   Medication Sig Start Date End Date Taking? Authorizing Provider  albuterol (PROVENTIL HFA;VENTOLIN HFA) 108 (90 BASE) MCG/ACT inhaler Inhale 2 puffs into the lungs every 6 (six) hours as needed for wheezing or shortness of breath.    Historical Provider, MD  budesonide (PULMICORT) 180 MCG/ACT inhaler Inhale 1 puff into the lungs 2 (two) times daily.    Historical Provider, MD  Calcium Carbonate-Vitamin D (CALTRATE 600+D PO) Take 2 tablets by mouth daily.    Historical Provider, MD  Camphor-Menthol-Methyl Sal (SALONPAS) 1.2-5.7-6.3 % PTCH Apply 1 patch topically daily as needed (for right shoulder pain).    Historical Provider, MD  cetirizine (ZYRTEC) 10 MG tablet Take 10 mg by mouth at bedtime.    Historical Provider, MD  fluticasone (  FLONASE) 50 MCG/ACT nasal spray Place 1 spray into the nose daily.    Historical Provider, MD  lisinopril-hydrochlorothiazide (PRINZIDE,ZESTORETIC) 20-25 MG per tablet Take 1 tablet by mouth daily.    Historical Provider, MD  metFORMIN (GLUCOPHAGE) 500 MG tablet Take 500 mg by mouth daily.    Historical Provider, MD  montelukast (SINGULAIR) 10 MG tablet Take 10 mg by mouth at bedtime.    Historical Provider, MD  naphazoline (NAPHCON) 0.1 % ophthalmic solution Place 1 drop into both eyes 4 (four) times daily as needed (for allergy eye  relief).    Historical Provider, MD  pravastatin (PRAVACHOL) 20 MG tablet Take 20 mg by mouth at bedtime.    Historical Provider, MD  traMADol (ULTRAM) 50 MG tablet Take 50 mg by mouth every 6 (six) hours as needed for pain.    Historical Provider, MD   BP 125/78 mmHg  Pulse 109  Temp(Src) 99.6 F (37.6 C) (Oral)  Resp 18  Ht 5\' 3"  (1.6 m)  Wt 180 lb (81.647 kg)  BMI 31.89 kg/m2  SpO2 97% Physical Exam  Constitutional: She is oriented to person, place, and time. She appears well-developed and well-nourished. No distress.  HENT:  Head: Normocephalic and atraumatic.  Neck: Normal range of motion. Neck supple.  Cardiovascular: Normal rate and regular rhythm.  Exam reveals no gallop and no friction rub.   No murmur heard. Pulmonary/Chest: Effort normal and breath sounds normal. No respiratory distress. She has no wheezes.  Abdominal: Soft. Bowel sounds are normal. She exhibits no distension. There is tenderness. There is no rebound and no guarding.  There is mild tenderness to palpation in the left lower and left upper quadrants.  Musculoskeletal: Normal range of motion.  Neurological: She is alert and oriented to person, place, and time.  Skin: Skin is warm and dry. She is not diaphoretic.  Nursing note and vitals reviewed.   ED Course  Procedures (including critical care time) Labs Review Labs Reviewed  COMPREHENSIVE METABOLIC PANEL  CBC WITH DIFFERENTIAL/PLATELET  LIPASE, BLOOD  URINALYSIS, ROUTINE W REFLEX MICROSCOPIC (NOT AT [facility])    Imaging Review No results found.   EKG Interpretation None      MDM   Final diagnoses:  None    Patient found to have a 5x10 mm stone in the proximal ureter along with TNTC white cells in her urine and wbc of 25.5. I have spoken with Dr. I have spoken with Dr. Diona Fanti from Urology who will come see the patient in the ED and likely place a ureteral stent. She was given IV rocephin.    Veryl Speak, MD 01/31/15 [phone]

## 2015-01-30 NOTE — Transfer of Care (Signed)
Immediate Anesthesia Transfer of Care Note  Patient: Colleen Dixon  Procedure(s) Performed: Procedure(s): CYSTOSCOPY WITH STENT PLACEMENT (Left)  Patient Location: PACU  Anesthesia Type:MAC  Level of Consciousness: sedated and patient cooperative  Airway & Oxygen Therapy: Patient Spontanous Breathing and non-rebreather face mask  Post-op Assessment: Report given to RN, Post -op Vital signs reviewed and stable and Patient moving all extremities  Post vital signs: Reviewed and stable   Complications: No apparent anesthesia complications

## 2015-01-30 NOTE — ED Notes (Signed)
Uologist at bedside.

## 2015-01-30 NOTE — ED Notes (Signed)
Patient is resting comfortably. 

## 2015-01-30 NOTE — Consult Note (Signed)
Urology Consult   Physician requesting consult: Everlene Balls, MD  Reason for consult: Kidney stone  History of Present Illness: Colleen Dixon is a 64 y.o. female who presented earlier this afternoon to the [facility] emergency room with a 48-hour history of left flank/abdominal pain, nausea, chills. She did not have emesis, and has not noted any fever. There is no family or personal history of kidney stones. Evaluation included a CT scan revealing a 5 x 10 mm left proximal ureteral stone with hydronephrosis. There was leukocytosis and a urinalysis showed a urinary tract infection. Urologic consultation was requested.    Past Medical History  Diagnosis Date  . Diabetes mellitus without complication   . Hypertension   . Shoulder pain   . Asthma   . Acute colitis 04/2013    hospitalized. treated with cipro/flagyl    Past Surgical History  Procedure Laterality Date  . Rotator cuff repair    . Fibroids removed    . Abdominal hysterectomy      partial  . Tonsillectomy    . Colonoscopy  02/29/2008    GEX:[id number] rectal polyp s/p bx and ablation/diminutive cecal polyps  . Colonoscopy N/A 06/28/2013    Procedure: COLONOSCOPY;  Surgeon: Daneil Dolin, MD;  Location: [facility];  Service: Endoscopy;  Laterality: N/A;  8:30     Current Hospital Medications: Scheduled Meds: Continuous Infusions: PRN Meds:.  Allergies:  Allergies  Allergen Reactions  . Aspirin Nausea Only    Family History  Problem Relation Age of Onset  . Cancer Mother     Social History:  reports that she has quit smoking. She has never used smokeless tobacco. She reports that she drinks about 0.6 oz of alcohol per week. She reports that she does not use illicit drugs.  ROS: A complete review of systems was performed.  All systems are negative except for pertinent findings as noted.  Physical Exam:  Vital signs in last 24 hours: Temp:  [99.6 F (37.6 C)-102.4 F (39.1 C)] 102.4 F (39.1 C) (07/04  1638) Pulse Rate:  [89-109] 93 (07/04 1700) Resp:  [18-28] 28 (07/04 1700) BP: (124-175)/(53-78) 132/55 mmHg (07/04 1700) SpO2:  [93 %-99 %] 93 % (07/04 1700) Weight:  [81.647 kg (180 lb)] 81.647 kg (180 lb) (07/04 1253) General:  Alert and orishe is in mild distressENT: Normocephalic, atraumatic Neck: No JVD or lymphadenopathy Cardiac;Increased rate, regular rhythm Lungs: Clear bilaterally Abdomen: Soft, obese, left CVA and left upper/lower quadrant tenderness. No rebound or guarding.  Extremities: No edema Neurologic: Grossly intact  Laboratory Data:   Recent Labs  01/30/15 1318  WBC 25.5*  HGB 14.0  HCT 41.7  PLT 222     Recent Labs  01/30/15 1318  NA 135  K 3.3*  CL 99*  GLUCOSE 131*  BUN 20  CALCIUM 9.6  CREATININE 1.30*     Results for orders placed or performed during the hospital encounter of 01/30/15 (from the past 24 hour(s))  Comprehensive metabolic panel     Status: Abnormal   Collection Time: 01/30/15  1:18 PM  Result Value Ref Range   Sodium 135 135 - 145 mmol/L   Potassium 3.3 (L) 3.5 - 5.1 mmol/L   Chloride 99 (L) 101 - 111 mmol/L   CO2 25 22 - 32 mmol/L   Glucose, Bld 131 (H) 65 - 99 mg/dL   BUN 20 6 - 20 mg/dL   Creatinine, Ser 1.30 (H) 0.44 - 1.00 mg/dL   Calcium 9.6  8.9 - 10.3 mg/dL   Total Protein 7.6 6.5 - 8.1 g/dL   Albumin 3.8 3.5 - 5.0 g/dL   AST 24 15 - 41 U/L   ALT 23 14 - 54 U/L   Alkaline Phosphatase 92 38 - 126 U/L   Total Bilirubin 1.1 0.3 - 1.2 mg/dL   GFR calc non Af Amer 43 (L) >60 mL/min   GFR calc Af Amer 50 (L) >60 mL/min   Anion gap 11 5 - 15  CBC with Differential     Status: Abnormal   Collection Time: 01/30/15  1:18 PM  Result Value Ref Range   WBC 25.5 (H) 4.0 - 10.5 K/uL   RBC 4.68 3.87 - 5.11 MIL/uL   Hemoglobin 14.0 12.0 - 15.0 g/dL   HCT 41.7 36.0 - 46.0 %   MCV 89.1 78.0 - 100.0 fL   MCH 29.9 26.0 - 34.0 pg   MCHC 33.6 30.0 - 36.0 g/dL   RDW 13.7 11.5 - 15.5 %   Platelets 222 150 - 400 K/uL    Neutrophils Relative % 90 (H) 43 - 77 %   Neutro Abs 22.9 (H) 1.7 - 7.7 K/uL   Lymphocytes Relative 5 (L) 12 - 46 %   Lymphs Abs 1.2 0.7 - 4.0 K/uL   Monocytes Relative 6 3 - 12 %   Monocytes Absolute 1.4 (H) 0.1 - 1.0 K/uL   Eosinophils Relative 0 0 - 5 %   Eosinophils Absolute 0.0 0.0 - 0.7 K/uL   Basophils Relative 0 0 - 1 %   Basophils Absolute 0.0 0.0 - 0.1 K/uL  Lipase, blood     Status: Abnormal   Collection Time: 01/30/15  1:18 PM  Result Value Ref Range   Lipase 15 (L) 22 - 51 U/L  Urinalysis, Routine w reflex microscopic (not at [facility])     Status: Abnormal   Collection Time: 01/30/15  1:40 PM  Result Value Ref Range   Color, Urine YELLOW YELLOW   APPearance CLEAR CLEAR   Specific Gravity, Urine 1.010 1.005 - 1.030   pH 6.0 5.0 - 8.0   Glucose, UA NEGATIVE NEGATIVE mg/dL   Hgb urine dipstick MODERATE (A) NEGATIVE   Bilirubin Urine NEGATIVE NEGATIVE   Ketones, ur NEGATIVE NEGATIVE mg/dL   Protein, ur NEGATIVE NEGATIVE mg/dL   Urobilinogen, UA 0.2 0.0 - 1.0 mg/dL   Nitrite NEGATIVE NEGATIVE   Leukocytes, UA SMALL (A) NEGATIVE  Urine microscopic-add on     Status: Abnormal   Collection Time: 01/30/15  1:40 PM  Result Value Ref Range   Squamous Epithelial / LPF MANY (A) RARE   WBC, UA TOO NUMEROUS TO COUNT <3 WBC/hpf   RBC / HPF TOO NUMEROUS TO COUNT <3 RBC/hpf   Bacteria, UA FEW (A) RARE   No results found for this or any previous visit (from the past 240 hour(s)).  Renal Function:  Recent Labs  01/30/15 1318  CREATININE 1.30*   Estimated Creatinine Clearance: 44.8 mL/min (by C-G formula based on Cr of 1.3).  Radiologic Imaging: Ct Abdomen Pelvis W Contrast  01/30/2015   CLINICAL DATA:  Left lower quadrant pain.  EXAM: CT ABDOMEN AND PELVIS WITH CONTRAST  TECHNIQUE: Multidetector CT imaging of the abdomen and pelvis was performed using the standard protocol following bolus administration of intravenous contrast.  CONTRAST:  20mL OMNIPAQUE IOHEXOL 300 MG/ML SOLN,  155mL OMNIPAQUE IOHEXOL 300 MG/ML SOLN  COMPARISON:  May 04, 2013  FINDINGS: There is left hydronephrosis with left  perinephric due to obstruction by a 5.3 x 10 mm stone in the proximal left ureter. The right kidney is normal.  There is mild diffuse low density of the liver. There is no focal liver lesion. The spleen, pancreas, gallbladder, adrenal glands, and aorta are normal. There is no small bowel obstruction or diverticulitis. The appendix is normal.  The bladder is normal. The uterus is not seen. There is mild atelectasis of bilateral lungs. Degenerative changes of the spine are noted.  IMPRESSION: There is left hydronephrosis with left perinephric due to obstruction by a 5.3 x 10 mm stone in the proximal left ureter.   Electronically Signed   By: Abelardo Diesel M.D.   On: 01/30/2015 15:52   Dg Chest Port 1 View  01/30/2015   CLINICAL DATA:  Left-sided pain and dizzy since Saturday.  EXAM: PORTABLE CHEST - 1 VIEW  COMPARISON:  May 04, 2013  FINDINGS: The mediastinal contour is normal. The heart size is enlarged. There is no focal infiltrate, pulmonary edema, or pleural effusion. The visualized skeletal structures are unremarkable.  IMPRESSION: No active cardiopulmonary disease.   Electronically Signed   By: Abelardo Diesel M.D.   On: 01/30/2015 17:25    I independently reviewed the above imaging studies.  Impression/Assessment:   moderate/large left proximal ureteral stone with urinary tract infection, hydronephrosis, leukocytosis, fever  Plan:   I have discussed further management with the patient. As she has an infection, leukocytosis and obstruction, I strongly recommended urgent decompression of her left renal unit. Options include percutaneous nephrostomy placement versus cystoscopy and stent. As this is a moderate sized stone , I think that this can be managed with a double-J stent placement by cystoscopy. I recommended that. The patient agrees. We will proceed this afternoon. She will need  to be admitted to the hospital-I will contact the hospital service about admitting her

## 2015-01-30 NOTE — Op Note (Signed)
Preoperative diagnosis: Infected, obstructing left proximal ureteral stone   Postoperative diagnosis: Same   Procedure: Cystoscopy, placement of left double-J stent-24 cm x 6 French contour without string    Surgeon: Lillette Boxer. Emali Heyward, M.D.   Anesthesia: Gen.   Complications: None  Specimen(s): None  Drain(s): Above-mentioned stent  Indications: 64 year old female who presented to the emergency room here at any 10 earlier today with a 2 day history of left flank pain. She was found to have an obstructing left proximal ureteral stone as well as an infection in her urine and leukocytosis. She additionally has a fever. Urgent decompression of her left kidney was recommended. She presents at this time for stent placement, to be followed eventually by further management of her kidney stone    Technique and findings: The patient was taken the operating room. She was placed on the operating table and general anesthesia was administered. She was placed in the dorsolithotomy position. Genitalia and perineum were prepped and draped. Proper timeout was performed.  A 21 French cystoscope was advanced into her bladder. The bladder was inspected circumferentially. There were no tumors trabeculations or foreign bodies. Ureteral orifices were normal in configuration and location. The left ureteral orifice was cannulated with a 0.038 inch sensor-tip wire which was advanced fluoroscopically up past the stone and into the left renal pelvis/upper pole calyces. Over top of this, using both cystoscopic and fluoroscopic guidance, I passed a 24 cm x 6 French contour stent with the string removed. Once adequate positioning was verified with fluoroscopy, the guidewire was removed. Good proximal and distal curls of her stent were seen. The scope was removed. The patient was then awakened and taken to the PACU in stable condition.

## 2015-01-31 ENCOUNTER — Encounter (HOSPITAL_COMMUNITY): Payer: Self-pay | Admitting: Urology

## 2015-01-31 DIAGNOSIS — N132 Hydronephrosis with renal and ureteral calculous obstruction: Secondary | ICD-10-CM

## 2015-01-31 LAB — GLUCOSE, CAPILLARY
Glucose-Capillary: 116 mg/dL — ABNORMAL HIGH (ref 65–99)
Glucose-Capillary: 114 mg/dL — ABNORMAL HIGH (ref 65–99)
Glucose-Capillary: 107 mg/dL — ABNORMAL HIGH (ref 65–99)
Glucose-Capillary: 141 mg/dL — ABNORMAL HIGH (ref 65–99)
Glucose-Capillary: 89 mg/dL (ref 65–99)

## 2015-01-31 LAB — COMPREHENSIVE METABOLIC PANEL
ALBUMIN: 3 g/dL — AB (ref 3.5–5.0)
ALK PHOS: 78 U/L (ref 38–126)
ALT: 19 U/L (ref 14–54)
ANION GAP: 8 (ref 5–15)
AST: 18 U/L (ref 15–41)
BUN: 19 mg/dL (ref 6–20)
CO2: 27 mmol/L (ref 22–32)
Calcium: 8.3 mg/dL — ABNORMAL LOW (ref 8.9–10.3)
Chloride: 101 mmol/L (ref 101–111)
Creatinine, Ser: 1.24 mg/dL — ABNORMAL HIGH (ref 0.44–1.00)
GFR calc Af Amer: 52 mL/min — ABNORMAL LOW (ref >60)
GFR calc non Af Amer: 45 mL/min — ABNORMAL LOW (ref >60)
Glucose, Bld: 116 mg/dL — ABNORMAL HIGH (ref 65–99)
Potassium: 3.3 mmol/L — ABNORMAL LOW (ref 3.5–5.1)
Sodium: 136 mmol/L (ref 135–145)
TOTAL PROTEIN: 6.5 g/dL (ref 6.5–8.1)
Total Bilirubin: 1 mg/dL (ref 0.3–1.2)

## 2015-01-31 LAB — CBC
HCT: 38.1 % (ref 36.0–46.0)
Hemoglobin: 12.3 g/dL (ref 12.0–15.0)
MCH: 29.3 pg (ref 26.0–34.0)
MCHC: 32.3 g/dL (ref 30.0–36.0)
MCV: 90.7 fL (ref 78.0–100.0)
PLATELETS: 188 10*3/uL (ref 150–400)
RBC: 4.2 MIL/uL (ref 3.87–5.11)
RDW: 14.1 % (ref 11.5–15.5)
WBC: 22.1 10*3/uL — ABNORMAL HIGH (ref 4.0–10.5)

## 2015-01-31 MED ORDER — BISACODYL 5 MG PO TBEC
10.0000 mg | DELAYED_RELEASE_TABLET | Freq: Once | ORAL | Status: AC
  Administered 2015-01-31: 10 mg via ORAL
  Filled 2015-01-31: qty 2

## 2015-01-31 NOTE — Progress Notes (Signed)
[facility] PROGRESS NOTE  Colleen Dixon SWH:[id number] DOB: 15-Mar-1951 DOA: 01/30/2015 PCP: Christoper Fabian, MD  Interim progress note Admitted 7/4 as a direct admission post urological procedure for left sided hydronephrosis. Has a UTI and she is on Rocephin pending culture data, urology is following. Consider discharge home on oral antibiotics once afebrile.  Assessment/Plan: Left hydronephrosis -Status post cystoscopy and placement of left double-J stent. -To be managed by urology.  UTI -Continue Rocephin pending culture data. -Was febrile to 100.4 yesterday evening.  Hypertension -Controlled, continue current management.  Diabetes -Well controlled, diet controlled at home.  Code Status: Full code Family Communication: Patient only  Disposition Plan: Anticipate discharge home in 24-48 hours   Consultants:  Urology   Antibiotics:  Rocephin   Subjective: Patient has no specific complaints, no chest pain, shortness of breath or dysuria.  Objective: Filed Vitals:   01/30/15 2245 01/31/15 0558 01/31/15 0703 01/31/15 1103  BP:  102/46  137/55  Pulse:    91  Temp: 100.4 F (38 C) 99.8 F (37.7 C)    TempSrc: Oral Oral    Resp:  20    Height:      Weight:      SpO2:  97% 96%     Intake/Output Summary (Last 24 hours) at 01/31/15 1351 Last data filed at 01/31/15 1210  Gross per 24 hour  Intake 1601.67 ml  Output    202 ml  Net 1399.67 ml   Filed Weights   01/30/15 1253 01/30/15 2009  Weight: 81.647 kg (180 lb) 82.509 kg (181 lb 14.4 oz)    Exam:   General:  Alert, awake, oriented 3  Cardiovascular: Regular rate and rhythm, no murmurs, rubs or gallops  Respiratory: Clear to auscultation bilaterally  Abdomen: Soft, nontender, nondistended, positive bowel sounds  Extremities: No clubbing, cyanosis or edema, positive pedal pulses   Neurologic:  Grossly intact and nonfocal  Data Reviewed: Basic Metabolic Panel:  Recent Labs Lab  01/30/15 1318 01/31/15 0603  NA 135 136  K 3.3* 3.3*  CL 99* 101  CO2 25 27  GLUCOSE 131* 116*  BUN 20 19  CREATININE 1.30* 1.24*  CALCIUM 9.6 8.3*   Liver Function Tests:  Recent Labs Lab 01/30/15 1318 01/31/15 0603  AST 24 18  ALT 23 19  ALKPHOS 92 78  BILITOT 1.1 1.0  PROT 7.6 6.5  ALBUMIN 3.8 3.0*    Recent Labs Lab 01/30/15 1318  LIPASE 15*   No results for input(s): AMMONIA in the last 168 hours. CBC:  Recent Labs Lab 01/30/15 1318 01/31/15 0603  WBC 25.5* 22.1*  NEUTROABS 22.9*  --   HGB 14.0 12.3  HCT 41.7 38.1  MCV 89.1 90.7  PLT 222 188   Cardiac Enzymes: No results for input(s): CKTOTAL, CKMB, CKMBINDEX, TROPONINI in the last 168 hours. BNP (last 3 results) No results for input(s): BNP in the last 8760 hours.  ProBNP (last 3 results) No results for input(s): PROBNP in the last 8760 hours.  CBG:  Recent Labs Lab 01/30/15 2222 01/31/15 0733 01/31/15 1151  GLUCAP 116* 114* 107*    No results found for this or any previous visit (from the past 240 hour(s)).   Studies: Ct Abdomen Pelvis W Contrast  01/30/2015   CLINICAL DATA:  Left lower quadrant pain.  EXAM: CT ABDOMEN AND PELVIS WITH CONTRAST  TECHNIQUE: Multidetector CT imaging of the abdomen and pelvis was performed using the standard protocol following bolus administration of intravenous contrast.  CONTRAST:  45mL OMNIPAQUE IOHEXOL 300 MG/ML SOLN, 145mL OMNIPAQUE IOHEXOL 300 MG/ML SOLN  COMPARISON:  May 04, 2013  FINDINGS: There is left hydronephrosis with left perinephric due to obstruction by a 5.3 x 10 mm stone in the proximal left ureter. The right kidney is normal.  There is mild diffuse low density of the liver. There is no focal liver lesion. The spleen, pancreas, gallbladder, adrenal glands, and aorta are normal. There is no small bowel obstruction or diverticulitis. The appendix is normal.  The bladder is normal. The uterus is not seen. There is mild atelectasis of bilateral  lungs. Degenerative changes of the spine are noted.  IMPRESSION: There is left hydronephrosis with left perinephric due to obstruction by a 5.3 x 10 mm stone in the proximal left ureter.   Electronically Signed   By: Abelardo Diesel M.D.   On: 01/30/2015 15:52   Dg Chest Port 1 View  01/30/2015   CLINICAL DATA:  Left-sided pain and dizzy since Saturday.  EXAM: PORTABLE CHEST - 1 VIEW  COMPARISON:  May 04, 2013  FINDINGS: The mediastinal contour is normal. The heart size is enlarged. There is no focal infiltrate, pulmonary edema, or pleural effusion. The visualized skeletal structures are unremarkable.  IMPRESSION: No active cardiopulmonary disease.   Electronically Signed   By: Abelardo Diesel M.D.   On: 01/30/2015 17:25   Dg C-arm 1-60 Min-no Report  01/30/2015   CLINICAL DATA: infected left kidney stone   C-ARM 1-60 MINUTES  Fluoroscopy was utilized by the requesting physician.  No radiographic  interpretation.     Scheduled Meds: . budesonide (PULMICORT) nebulizer solution  0.25 mg Nebulization BID  . cefTRIAXone (ROCEPHIN)  IV  1 g Intravenous Q24H  . enoxaparin (LOVENOX) injection  40 mg Subcutaneous Q24H  . fluticasone  1 spray Each Nare Daily  . lisinopril  20 mg Oral Daily   And  . hydrochlorothiazide  25 mg Oral Daily  . insulin aspart  0-20 Units Subcutaneous TID WC  . insulin aspart  0-5 Units Subcutaneous QHS  . loratadine  10 mg Oral Daily  . montelukast  10 mg Oral QHS   Continuous Infusions: . sodium chloride 100 mL/hr at 01/31/15 1975    Active Problems:   Diabetes mellitus type 2 in obese   Hydronephrosis   UTI (urinary tract infection)   Essential hypertension    Time spent: 30 minutes. Greater than 50% of this time was spent in direct contact with the patient coordinating care.    Lelon Frohlich  [facility] Pager [phone]  If 7PM-7AM, please contact night-coverage at [email or web address], password [facility] 01/31/2015, 1:51 PM  LOS: 1 day

## 2015-01-31 NOTE — Progress Notes (Signed)
1 Day Post-Op Subjective: Patient reports no flank pain, nausea or emesis  Objective: Vital signs in last 24 hours: Temp:  [99.6 F (37.6 C)-102.8 F (39.3 C)] 99.8 F (37.7 C) (07/05 0558) Pulse Rate:  [87-109] 107 (07/04 2009) Resp:  [18-28] 20 (07/05 0558) BP: (102-175)/(39-78) 102/46 mmHg (07/05 0558) SpO2:  [92 %-100 %] 96 % (07/05 0703) Weight:  [81.647 kg (180 lb)-82.509 kg (181 lb 14.4 oz)] 82.509 kg (181 lb 14.4 oz) (07/04 2009)  Intake/Output from previous day: 07/04 0701 - 07/05 0700 In: 1241.7 [I.V.:1241.7] Out: 2 [Urine:2] Intake/Output this shift:    Physical Exam:  Constitutional: Vital signs reviewed. WD WN in NAD   Eyes: PERRL, No scleral icterus.   Pulmonary/Chest: Normal effort  Lab Results:  Recent Labs  01/30/15 1318 01/31/15 0603  HGB 14.0 12.3  HCT 41.7 38.1   BMET  Recent Labs  01/30/15 1318 01/31/15 0603  NA 135 136  K 3.3* 3.3*  CL 99* 101  CO2 25 27  GLUCOSE 131* 116*  BUN 20 19  CREATININE 1.30* 1.24*  CALCIUM 9.6 8.3*   No results for input(s): LABPT, INR in the last 72 hours. No results for input(s): LABURIN in the last 72 hours. No results found for this or any previous visit.  Studies/Results: Ct Abdomen Pelvis W Contrast  01/30/2015   CLINICAL DATA:  Left lower quadrant pain.  EXAM: CT ABDOMEN AND PELVIS WITH CONTRAST  TECHNIQUE: Multidetector CT imaging of the abdomen and pelvis was performed using the standard protocol following bolus administration of intravenous contrast.  CONTRAST:  19mL OMNIPAQUE IOHEXOL 300 MG/ML SOLN, 169mL OMNIPAQUE IOHEXOL 300 MG/ML SOLN  COMPARISON:  May 04, 2013  FINDINGS: There is left hydronephrosis with left perinephric due to obstruction by a 5.3 x 10 mm stone in the proximal left ureter. The right kidney is normal.  There is mild diffuse low density of the liver. There is no focal liver lesion. The spleen, pancreas, gallbladder, adrenal glands, and aorta are normal. There is no small bowel  obstruction or diverticulitis. The appendix is normal.  The bladder is normal. The uterus is not seen. There is mild atelectasis of bilateral lungs. Degenerative changes of the spine are noted.  IMPRESSION: There is left hydronephrosis with left perinephric due to obstruction by a 5.3 x 10 mm stone in the proximal left ureter.   Electronically Signed   By: Abelardo Diesel M.D.   On: 01/30/2015 15:52   Dg Chest Port 1 View  01/30/2015   CLINICAL DATA:  Left-sided pain and dizzy since Saturday.  EXAM: PORTABLE CHEST - 1 VIEW  COMPARISON:  May 04, 2013  FINDINGS: The mediastinal contour is normal. The heart size is enlarged. There is no focal infiltrate, pulmonary edema, or pleural effusion. The visualized skeletal structures are unremarkable.  IMPRESSION: No active cardiopulmonary disease.   Electronically Signed   By: Abelardo Diesel M.D.   On: 01/30/2015 17:25   Dg C-arm 1-60 Min-no Report  01/30/2015   CLINICAL DATA: infected left kidney stone   C-ARM 1-60 MINUTES  Fluoroscopy was utilized by the requesting physician.  No radiographic  interpretation.     Assessment/Plan:   POD 1 left J2 stent placement for infected stone. Doing well. Still febrile.   I'll order KUB to assess stone for possibility of eventual ESL after infection appropriately treated.   OK to d/c once afebrile on 10 day abx course. We will arrange followup   LOS: 1 day  Colleen Dixon M 01/31/2015, 9:00 AM

## 2015-01-31 NOTE — Anesthesia Postprocedure Evaluation (Signed)
  Anesthesia Post-op Note  Patient: Colleen Dixon  Procedure(s) Performed: Procedure(s): CYSTOSCOPY WITH LEFT DOUBLE J URETERAL STENT PLACEMENT (Left)  Patient Location: room 327  Anesthesia Type:MAC  Level of Consciousness: awake, alert , oriented and patient cooperative  Airway and Oxygen Therapy: Patient Spontanous Breathing  Post-op Pain: none  Post-op Assessment: Post-op Vital signs reviewed, Patient's Cardiovascular Status Stable, Respiratory Function Stable, Patent Airway, No signs of Nausea or vomiting, Adequate PO intake, Pain level controlled and No headache              Post-op Vital Signs: Reviewed and stable  Last Vitals:  Filed Vitals:   01/31/15 0558  BP: 102/46  Pulse:   Temp: 37.7 C  Resp: 20    Complications: No apparent anesthesia complications

## 2015-01-31 NOTE — Addendum Note (Signed)
Addendum  created 01/31/15 1106 by Charmaine Downs, CRNA   Modules edited: Notes Section   Notes Section:  File: [id number]

## 2015-02-01 DIAGNOSIS — N3000 Acute cystitis without hematuria: Secondary | ICD-10-CM

## 2015-02-01 DIAGNOSIS — N1339 Other hydronephrosis: Secondary | ICD-10-CM

## 2015-02-01 LAB — GLUCOSE, CAPILLARY
GLUCOSE-CAPILLARY: 92 mg/dL (ref 65–99)
Glucose-Capillary: 108 mg/dL — ABNORMAL HIGH (ref 65–99)
Glucose-Capillary: 106 mg/dL — ABNORMAL HIGH (ref 65–99)
Glucose-Capillary: 112 mg/dL — ABNORMAL HIGH (ref 65–99)

## 2015-02-01 LAB — BASIC METABOLIC PANEL
Anion gap: 9 (ref 5–15)
BUN: 14 mg/dL (ref 6–20)
CO2: 25 mmol/L (ref 22–32)
Calcium: 8.1 mg/dL — ABNORMAL LOW (ref 8.9–10.3)
Chloride: 103 mmol/L (ref 101–111)
Creatinine, Ser: 0.84 mg/dL (ref 0.44–1.00)
GFR calc Af Amer: >60 mL/min (ref >60)
GFR calc non Af Amer: >60 mL/min (ref >60)
Glucose, Bld: 105 mg/dL — ABNORMAL HIGH (ref 65–99)
Potassium: 3.1 mmol/L — ABNORMAL LOW (ref 3.5–5.1)
SODIUM: 137 mmol/L (ref 135–145)

## 2015-02-01 LAB — CBC
HCT: 36.9 % (ref 36.0–46.0)
HEMOGLOBIN: 11.8 g/dL — AB (ref 12.0–15.0)
MCH: 29.2 pg (ref 26.0–34.0)
MCHC: 32 g/dL (ref 30.0–36.0)
MCV: 91.3 fL (ref 78.0–100.0)
Platelets: 190 10*3/uL (ref 150–400)
RBC: 4.04 MIL/uL (ref 3.87–5.11)
RDW: 14.1 % (ref 11.5–15.5)
WBC: 15.8 10*3/uL — ABNORMAL HIGH (ref 4.0–10.5)

## 2015-02-01 MED ORDER — SODIUM CHLORIDE 0.9 % IV SOLN
1.0000 g | Freq: Three times a day (TID) | INTRAVENOUS | Status: DC
  Administered 2015-02-01 – 2015-02-02 (×3): 1 g via INTRAVENOUS
  Filled 2015-02-01 (×7): qty 1

## 2015-02-01 MED ORDER — POTASSIUM CHLORIDE CRYS ER 20 MEQ PO TBCR
40.0000 meq | EXTENDED_RELEASE_TABLET | ORAL | Status: AC
  Administered 2015-02-01 (×2): 40 meq via ORAL
  Filled 2015-02-01 (×2): qty 2

## 2015-02-01 NOTE — Progress Notes (Signed)
ANTIBIOTIC CONSULT NOTE - INITIAL  Pharmacy Consult for Meropenem Indication: possible ESBL, spiking fevers despite Rocephin, pending final cx  Allergies  Allergen Reactions  . Aspirin Nausea Only   Patient Measurements: Height: 5\' 3"  (160 cm) Weight: 181 lb 14.4 oz (82.509 kg) IBW/kg (Calculated) : 52.4  Vital Signs: Temp: 101.7 F (38.7 C) (07/06 1419) Temp Source: Oral (07/06 1419) BP: 154/66 mmHg (07/06 1419) Pulse Rate: 104 (07/06 1419) Intake/Output from previous day: 07/05 0701 - 07/06 0700 In: 2913.3 [P.O.:560; I.V.:2303.3; IV Piggyback:50] Out: 1900 [Urine:1900] Intake/Output from this shift: Total I/O In: 480 [P.O.:480] Out: 750 [Urine:750]  Labs:  Recent Labs  01/30/15 1318 01/31/15 0603 02/01/15 0622  WBC 25.5* 22.1* 15.8*  HGB 14.0 12.3 11.8*  PLT 222 188 190  CREATININE 1.30* 1.24* 0.84   Estimated Creatinine Clearance: 69.7 mL/min (by C-G formula based on Cr of 0.84). No results for input(s): VANCOTROUGH, VANCOPEAK, VANCORANDOM, GENTTROUGH, GENTPEAK, GENTRANDOM, TOBRATROUGH, TOBRAPEAK, TOBRARND, AMIKACINPEAK, AMIKACINTROU, AMIKACIN in the last 72 hours.   Microbiology: Recent Results (from the past 720 hour(s))  Urine culture     Status: None (Preliminary result)   Collection Time: 01/30/15  4:25 PM  Result Value Ref Range Status   Specimen Description URINE, CLEAN CATCH  Final   Special Requests Normal  Final   Culture   Final    >=100,000 COLONIES/mL GRAM NEGATIVE RODS Performed at [facility]    Report Status PENDING  Incomplete    Medical History: Past Medical History  Diagnosis Date  . Diabetes mellitus without complication   . Hypertension   . Shoulder pain   . Asthma   . Acute colitis 04/2013    hospitalized. treated with cipro/flagyl   Meropenem 7/6 >> Rocephin 7/5>7/6  Assessment: 64yo female still spiking fevers despite Rocephin.  Urine cx with > 100K GNR, ID and SENS pending.  Asked to empirically start  Meropenem to cover for possible ESBL pending final cx.  Pt has good renal fxn.  Goal of Therapy:  Eradicate infection.  Plan:  Meropenem 1gm IV q8h Monitor labs and c/s  Hart Robinsons A 02/01/2015,3:07 PM

## 2015-02-01 NOTE — Care Management Note (Signed)
Case Management Note  Patient Details  Name: Colleen Dixon MRN: [id number] Date of Birth: 12/16/1950  Subjective/Objective:                  Pt admitted from home with hydronephrosis. Pt lives with her husband and will return home at discharge. Pt is independent with ADL's.  Action/Plan: No CM needs noted.  Expected Discharge Date:   02/03/15               Expected Discharge Plan:  Home/Self Care  In-House Referral:  NA  Discharge planning Services  CM Consult  Post Acute Care Choice:  NA Choice offered to:  NA  DME Arranged:    DME Agency:     HH Arranged:    HH Agency:     Status of Service:  Completed, signed off  Medicare Important Message Given:    Date Medicare IM Given:    Medicare IM give by:    Date Additional Medicare IM Given:    Additional Medicare Important Message give by:     If discussed at [place] of Stay Meetings, dates discussed:    Additional Comments:  Joylene Draft, RN 02/01/2015, 12:59 PM

## 2015-02-01 NOTE — Progress Notes (Signed)
1459 Patient temp 101.7, tylenol given. Pt c/o LEFT side pain and urinary frequency. MD notified.

## 2015-02-01 NOTE — Progress Notes (Addendum)
[facility] PROGRESS NOTE  Colleen Dixon RUE:[id number] DOB: 1950/09/23 DOA: 01/30/2015 PCP: Christoper Fabian, MD  Interim progress note Admitted 7/4 as a direct admission post urological procedure for left sided hydronephrosis. Has a UTI and she is on Rocephin pending culture data, urology is following. Consider discharge home on oral antibiotics once afebrile.  Assessment/Plan: Left hydronephrosis -Status post cystoscopy and placement of left double-J stent. -To be managed by urology.  Sepsis secondary to UTI - Patient with fever and leukocytosis -Continue Rocephin  -Was febrile to 101.4 yesterday evening. - Urine culture and blood culture still pending  Hypokalemia - Repleted, recheck in a.m.  Hypertension -Controlled, continue current management.  Diabetes -Well controlled, diet controlled at home.  Code Status: Full code Family Communication: Patient only  Disposition Plan: Anticipate discharge home, when patient is afebrile, and culture results are known.   Consultants:  Urology   Antibiotics:  Rocephin   Subjective: Patient has no specific complaints, no chest pain, shortness of breath or dysuria. Fever 101.4 yesterday evening.  Objective: Filed Vitals:   01/31/15 1940 01/31/15 2125 02/01/15 0009 02/01/15 0535  BP:  114/51  134/64  Pulse:  92  75  Temp:  101.4 F (38.6 C) 99 F (37.2 C) 99.6 F (37.6 C)  TempSrc:  Oral Oral Oral  Resp:  20  18  Height:      Weight:      SpO2: 92% 95%  97%    Intake/Output Summary (Last 24 hours) at 02/01/15 1023 Last data filed at 02/01/15 1478  Gross per 24 hour  Intake 2793.33 ml  Output   1900 ml  Net 893.33 ml   Filed Weights   01/30/15 1253 01/30/15 2009  Weight: 81.647 kg (180 lb) 82.509 kg (181 lb 14.4 oz)    Exam:   General:  Alert, awake, oriented 3  Cardiovascular: Regular rate and rhythm, no murmurs, rubs or gallops  Respiratory: Clear to auscultation bilaterally  Abdomen:  Soft, nontender, nondistended, positive bowel sounds  Extremities: No clubbing, cyanosis or edema, positive pedal pulses   Neurologic:  Grossly intact and nonfocal  Data Reviewed: Basic Metabolic Panel:  Recent Labs Lab 01/30/15 1318 01/31/15 0603 02/01/15 0622  NA 135 136 137  K 3.3* 3.3* 3.1*  CL 99* 101 103  CO2 25 27 25   GLUCOSE 131* 116* 105*  BUN 20 19 14   CREATININE 1.30* 1.24* 0.84  CALCIUM 9.6 8.3* 8.1*   Liver Function Tests:  Recent Labs Lab 01/30/15 1318 01/31/15 0603  AST 24 18  ALT 23 19  ALKPHOS 92 78  BILITOT 1.1 1.0  PROT 7.6 6.5  ALBUMIN 3.8 3.0*    Recent Labs Lab 01/30/15 1318  LIPASE 15*   No results for input(s): AMMONIA in the last 168 hours. CBC:  Recent Labs Lab 01/30/15 1318 01/31/15 0603 02/01/15 0622  WBC 25.5* 22.1* 15.8*  NEUTROABS 22.9*  --   --   HGB 14.0 12.3 11.8*  HCT 41.7 38.1 36.9  MCV 89.1 90.7 91.3  PLT 222 188 190   Cardiac Enzymes: No results for input(s): CKTOTAL, CKMB, CKMBINDEX, TROPONINI in the last 168 hours. BNP (last 3 results) No results for input(s): BNP in the last 8760 hours.  ProBNP (last 3 results) No results for input(s): PROBNP in the last 8760 hours.  CBG:  Recent Labs Lab 01/31/15 0733 01/31/15 1151 01/31/15 1714 01/31/15 2129 02/01/15 0755  GLUCAP 114* 107* 141* 89 108*    No results found  for this or any previous visit (from the past 240 hour(s)).   Studies: Ct Abdomen Pelvis W Contrast  01/30/2015   CLINICAL DATA:  Left lower quadrant pain.  EXAM: CT ABDOMEN AND PELVIS WITH CONTRAST  TECHNIQUE: Multidetector CT imaging of the abdomen and pelvis was performed using the standard protocol following bolus administration of intravenous contrast.  CONTRAST:  28mL OMNIPAQUE IOHEXOL 300 MG/ML SOLN, 135mL OMNIPAQUE IOHEXOL 300 MG/ML SOLN  COMPARISON:  May 04, 2013  FINDINGS: There is left hydronephrosis with left perinephric due to obstruction by a 5.3 x 10 mm stone in the proximal  left ureter. The right kidney is normal.  There is mild diffuse low density of the liver. There is no focal liver lesion. The spleen, pancreas, gallbladder, adrenal glands, and aorta are normal. There is no small bowel obstruction or diverticulitis. The appendix is normal.  The bladder is normal. The uterus is not seen. There is mild atelectasis of bilateral lungs. Degenerative changes of the spine are noted.  IMPRESSION: There is left hydronephrosis with left perinephric due to obstruction by a 5.3 x 10 mm stone in the proximal left ureter.   Electronically Signed   By: Abelardo Diesel M.D.   On: 01/30/2015 15:52   Dg Chest Port 1 View  01/30/2015   CLINICAL DATA:  Left-sided pain and dizzy since Saturday.  EXAM: PORTABLE CHEST - 1 VIEW  COMPARISON:  May 04, 2013  FINDINGS: The mediastinal contour is normal. The heart size is enlarged. There is no focal infiltrate, pulmonary edema, or pleural effusion. The visualized skeletal structures are unremarkable.  IMPRESSION: No active cardiopulmonary disease.   Electronically Signed   By: Abelardo Diesel M.D.   On: 01/30/2015 17:25   Dg C-arm 1-60 Min-no Report  01/30/2015   CLINICAL DATA: infected left kidney stone   C-ARM 1-60 MINUTES  Fluoroscopy was utilized by the requesting physician.  No radiographic  interpretation.     Scheduled Meds: . budesonide (PULMICORT) nebulizer solution  0.25 mg Nebulization BID  . cefTRIAXone (ROCEPHIN)  IV  1 g Intravenous Q24H  . enoxaparin (LOVENOX) injection  40 mg Subcutaneous Q24H  . fluticasone  1 spray Each Nare Daily  . lisinopril  20 mg Oral Daily   And  . hydrochlorothiazide  25 mg Oral Daily  . insulin aspart  0-20 Units Subcutaneous TID WC  . insulin aspart  0-5 Units Subcutaneous QHS  . loratadine  10 mg Oral Daily  . montelukast  10 mg Oral QHS  . potassium chloride  40 mEq Oral Q4H   Continuous Infusions: . sodium chloride 100 mL/hr at 01/31/15 8466    Active Problems:   Diabetes mellitus type 2 in  obese   Hydronephrosis   UTI (urinary tract infection)   Essential hypertension    Time spent: 30 minutes. Greater than 50% of this time was spent in direct contact with the patient coordinating care.    [facility], Eboni Coval  [facility] Pager [phone]  If 7PM-7AM, please contact night-coverage at [email or web address], password [facility] 02/01/2015, 10:23 AM  LOS: 2 days

## 2015-02-01 NOTE — Progress Notes (Signed)
65 Patient's K+ 3.1 this AM, MD notified.

## 2015-02-02 DIAGNOSIS — A419 Sepsis, unspecified organism: Secondary | ICD-10-CM

## 2015-02-02 LAB — BASIC METABOLIC PANEL
Anion gap: 7 (ref 5–15)
BUN: 10 mg/dL (ref 6–20)
CO2: 25 mmol/L (ref 22–32)
Calcium: 8.5 mg/dL — ABNORMAL LOW (ref 8.9–10.3)
Chloride: 106 mmol/L (ref 101–111)
Creatinine, Ser: 0.77 mg/dL (ref 0.44–1.00)
GLUCOSE: 115 mg/dL — AB (ref 65–99)
Potassium: 3.7 mmol/L (ref 3.5–5.1)
Sodium: 138 mmol/L (ref 135–145)

## 2015-02-02 LAB — GLUCOSE, CAPILLARY
GLUCOSE-CAPILLARY: 91 mg/dL (ref 65–99)
GLUCOSE-CAPILLARY: 124 mg/dL — AB (ref 65–99)
Glucose-Capillary: 100 mg/dL — ABNORMAL HIGH (ref 65–99)
Glucose-Capillary: 65 mg/dL (ref 65–99)
Glucose-Capillary: 101 mg/dL — ABNORMAL HIGH (ref 65–99)

## 2015-02-02 LAB — URINE CULTURE
Culture: >=100000
SPECIAL REQUESTS: NORMAL

## 2015-02-02 LAB — CBC
HEMATOCRIT: 39.1 % (ref 36.0–46.0)
Hemoglobin: 12.5 g/dL (ref 12.0–15.0)
MCH: 28.8 pg (ref 26.0–34.0)
MCHC: 32 g/dL (ref 30.0–36.0)
MCV: 90.1 fL (ref 78.0–100.0)
PLATELETS: 219 10*3/uL (ref 150–400)
RBC: 4.34 MIL/uL (ref 3.87–5.11)
RDW: 13.8 % (ref 11.5–15.5)
WBC: 12.4 10*3/uL — ABNORMAL HIGH (ref 4.0–10.5)

## 2015-02-02 MED ORDER — POLYETHYLENE GLYCOL 3350 17 G PO PACK
17.0000 g | PACK | Freq: Two times a day (BID) | ORAL | Status: DC
  Administered 2015-02-02 – 2015-02-03 (×3): 17 g via ORAL
  Filled 2015-02-02 (×3): qty 1

## 2015-02-02 MED ORDER — DEXTROSE 5 % IV SOLN
1.0000 g | INTRAVENOUS | Status: DC
  Administered 2015-02-02: 1 g via INTRAVENOUS
  Filled 2015-02-02 (×6): qty 10

## 2015-02-02 MED ORDER — POTASSIUM CHLORIDE CRYS ER 20 MEQ PO TBCR
40.0000 meq | EXTENDED_RELEASE_TABLET | Freq: Four times a day (QID) | ORAL | Status: AC
  Administered 2015-02-02 (×2): 40 meq via ORAL
  Filled 2015-02-02 (×2): qty 2

## 2015-02-02 NOTE — Progress Notes (Signed)
[facility] PROGRESS NOTE  Colleen Dixon GTX:[id number] DOB: [birth year] DOA: 01/30/2015 PCP: Christoper Fabian, MD  Interim progress note Admitted 7/4 as a direct admission post urological procedure for left sided hydronephrosis. Has a UTI and she is on Rocephin pending culture data, urology is following. Consider discharge home on oral antibiotics once afebrile.  Assessment/Plan: Left hydronephrosis -Status post cystoscopy and placement of left double-J stent. -To be managed by urology. - they will arrange for outpatient follow up.  Sepsis secondary to UTI - Patient with fever and leukocytosis  -Antibiotics changed yesterday from Rocephin to meropenem as continued to spike fever, but changed back to Rocephin today as urine culture growing Proteus sensitive to Rocephin. -Was febrile to 101.7 yesterday . - Urine culture growing Proteus sensitive to Rocephin   Hypokalemia - Repleted, recheck in a.m.  Hypertension -Controlled, continue current management.  Diabetes -Well controlled, diet controlled at home.  Code Status: Full code Family Communication: Patient only  Disposition Plan: Anticipate discharge home on oral antibiotics if she remains a febrile or 24 hours.   Consultants:  Urology   Antibiotics:  Rocephin   Subjective: Patient has no specific complaints, no chest pain, shortness of breath or dysuria. Fever 101.4 yesterday evening.  Objective: Filed Vitals:   02/01/15 2122 02/01/15 2215 02/02/15 0201 02/02/15 0415  BP: 172/96   122/80  Pulse: 72   60  Temp: 100 F (37.8 C) 98.9 F (37.2 C) 99 F (37.2 C) 98 F (36.7 C)  TempSrc: Oral  Oral Oral  Resp: 16   18  Height:      Weight:      SpO2: 100%   100%    Intake/Output Summary (Last 24 hours) at 02/02/15 1005 Last data filed at 02/02/15 2482  Gross per 24 hour  Intake 2792.08 ml  Output   2700 ml  Net  92.08 ml   Filed Weights   01/30/15 1253 01/30/15 2009  Weight: 81.647 kg (180  lb) 82.509 kg (181 lb 14.4 oz)    Exam:   General:  Alert, awake, oriented 3  Cardiovascular: Regular rate and rhythm, no murmurs, rubs or gallops  Respiratory: Clear to auscultation bilaterally  Abdomen: Soft, nontender, nondistended, positive bowel sounds  Extremities: No clubbing, cyanosis or edema, positive pedal pulses   Neurologic:  Grossly intact and nonfocal  Data Reviewed: Basic Metabolic Panel:  Recent Labs Lab 01/30/15 1318 01/31/15 0603 02/01/15 0622 02/02/15 0630  NA 135 136 137 138  K 3.3* 3.3* 3.1* 3.7  CL 99* 101 103 106  CO2 25 27 25 25   GLUCOSE 131* 116* 105* 115*  BUN 20 19 14 10   CREATININE 1.30* 1.24* 0.84 0.77  CALCIUM 9.6 8.3* 8.1* 8.5*   Liver Function Tests:  Recent Labs Lab 01/30/15 1318 01/31/15 0603  AST 24 18  ALT 23 19  ALKPHOS 92 78  BILITOT 1.1 1.0  PROT 7.6 6.5  ALBUMIN 3.8 3.0*    Recent Labs Lab 01/30/15 1318  LIPASE 15*   No results for input(s): AMMONIA in the last 168 hours. CBC:  Recent Labs Lab 01/30/15 1318 01/31/15 0603 02/01/15 0622 02/02/15 0630  WBC 25.5* 22.1* 15.8* 12.4*  NEUTROABS 22.9*  --   --   --   HGB 14.0 12.3 11.8* 12.5  HCT 41.7 38.1 36.9 39.1  MCV 89.1 90.7 91.3 90.1  PLT 222 188 190 219   Cardiac Enzymes: No results for input(s): CKTOTAL, CKMB, CKMBINDEX, TROPONINI in the last 168  hours. BNP (last 3 results) No results for input(s): BNP in the last 8760 hours.  ProBNP (last 3 results) No results for input(s): PROBNP in the last 8760 hours.  CBG:  Recent Labs Lab 02/01/15 0755 02/01/15 1119 02/01/15 1636 02/01/15 2118 02/02/15 0723  GLUCAP 108* 106* 112* 92 100*    Recent Results (from the past 240 hour(s))  Urine culture     Status: None   Collection Time: 01/30/15  4:25 PM  Result Value Ref Range Status   Specimen Description URINE, CLEAN CATCH  Final   Special Requests Normal  Final   Culture   Final    >=100,000 COLONIES/mL PROTEUS MIRABILIS Performed at  [facility]    Report Status 02/02/2015 FINAL  Final   Organism ID, Bacteria PROTEUS MIRABILIS  Final      Susceptibility   Proteus mirabilis - MIC*    AMPICILLIN <=2 SENSITIVE Sensitive     CEFAZOLIN <=4 SENSITIVE Sensitive     CEFTRIAXONE <=1 SENSITIVE Sensitive     CIPROFLOXACIN <=0.25 SENSITIVE Sensitive     GENTAMICIN <=1 SENSITIVE Sensitive     IMIPENEM 2 SENSITIVE Sensitive     NITROFURANTOIN 128 RESISTANT Resistant     TRIMETH/SULFA <=20 SENSITIVE Sensitive     AMPICILLIN/SULBACTAM <=2 SENSITIVE Sensitive     PIP/TAZO <=4 SENSITIVE Sensitive     * >=100,000 COLONIES/mL PROTEUS MIRABILIS     Studies: No results found.  Scheduled Meds: . budesonide (PULMICORT) nebulizer solution  0.25 mg Nebulization BID  . cefTRIAXone (ROCEPHIN)  IV  1 g Intravenous Q24H  . enoxaparin (LOVENOX) injection  40 mg Subcutaneous Q24H  . fluticasone  1 spray Each Nare Daily  . lisinopril  20 mg Oral Daily   And  . hydrochlorothiazide  25 mg Oral Daily  . insulin aspart  0-20 Units Subcutaneous TID WC  . insulin aspart  0-5 Units Subcutaneous QHS  . loratadine  10 mg Oral Daily  . montelukast  10 mg Oral QHS  . polyethylene glycol  17 g Oral BID   Continuous Infusions: . sodium chloride 75 mL/hr at 02/02/15 0026    Active Problems:   Diabetes mellitus type 2 in obese   Hydronephrosis   UTI (urinary tract infection)   Essential hypertension    Time spent: 30 minutes. Greater than 50% of this time was spent in direct contact with the patient coordinating care.    [facility], Drayce Tawil  [facility] Pager [phone]  If 7PM-7AM, please contact night-coverage at [email or web address], password [facility] 02/02/2015, 10:05 AM  LOS: 3 days

## 2015-02-02 NOTE — Progress Notes (Signed)
1841 IV access lost. Attempted to gain new IV access site x 4 times with patient's permission w/o success. AC notified and coming to unit to attempt new IV access.

## 2015-02-02 NOTE — Progress Notes (Signed)
Hypoglycemic Event  CBG: 65  Treatment: 15 GM carbohydrate snack  Symptoms: None  Follow-up CBG: Time:1827 CBG Result:124  Possible Reasons for Event: Unknown  Comments/MD notified: yes    Colleen Dixon L  Remember to initiate Hypoglycemia Order Set & complete

## 2015-02-03 DIAGNOSIS — I10 Essential (primary) hypertension: Secondary | ICD-10-CM

## 2015-02-03 DIAGNOSIS — N1330 Unspecified hydronephrosis: Secondary | ICD-10-CM

## 2015-02-03 DIAGNOSIS — N10 Acute tubulo-interstitial nephritis: Secondary | ICD-10-CM

## 2015-02-03 DIAGNOSIS — E119 Type 2 diabetes mellitus without complications: Secondary | ICD-10-CM

## 2015-02-03 DIAGNOSIS — E669 Obesity, unspecified: Secondary | ICD-10-CM

## 2015-02-03 LAB — CBC
HCT: 38.1 % (ref 36.0–46.0)
Hemoglobin: 12.3 g/dL (ref 12.0–15.0)
MCH: 28.9 pg (ref 26.0–34.0)
MCHC: 32.3 g/dL (ref 30.0–36.0)
MCV: 89.6 fL (ref 78.0–100.0)
PLATELETS: 270 10*3/uL (ref 150–400)
RBC: 4.25 MIL/uL (ref 3.87–5.11)
RDW: 13.9 % (ref 11.5–15.5)
WBC: 12.2 10*3/uL — ABNORMAL HIGH (ref 4.0–10.5)

## 2015-02-03 LAB — BASIC METABOLIC PANEL
Anion gap: 9 (ref 5–15)
BUN: 11 mg/dL (ref 6–20)
CALCIUM: 8.6 mg/dL — AB (ref 8.9–10.3)
CO2: 24 mmol/L (ref 22–32)
Chloride: 103 mmol/L (ref 101–111)
Creatinine, Ser: 0.79 mg/dL (ref 0.44–1.00)
GFR calc Af Amer: >60 mL/min (ref >60)
GFR calc non Af Amer: >60 mL/min (ref >60)
GLUCOSE: 100 mg/dL — AB (ref 65–99)
Potassium: 4 mmol/L (ref 3.5–5.1)
Sodium: 136 mmol/L (ref 135–145)

## 2015-02-03 LAB — GLUCOSE, CAPILLARY
Glucose-Capillary: 89 mg/dL (ref 65–99)
Glucose-Capillary: 114 mg/dL — ABNORMAL HIGH (ref 65–99)

## 2015-02-03 MED ORDER — CIPROFLOXACIN HCL 500 MG PO TABS: 500.0000 mg | ORAL_TABLET | Freq: Two times a day (BID) | ORAL | Status: DC

## 2015-02-03 NOTE — Care Management Note (Signed)
Case Management Note  Patient Details  Name: TWYLAH BENNETTS MRN: [id number] Date of Birth: Jan 13, 1951  Subjective/Objective:                    Action/Plan:   Expected Discharge Date:                  Expected Discharge Plan:  Home/Self Care  In-House Referral:  NA  Discharge planning Services  CM Consult  Post Acute Care Choice:  NA Choice offered to:  NA  DME Arranged:    DME Agency:     HH Arranged:    HH Agency:     Status of Service:  Completed, signed off  Medicare Important Message Given:    Date Medicare IM Given:    Medicare IM give by:    Date Additional Medicare IM Given:    Additional Medicare Important Message give by:     If discussed at [place] of Stay Meetings, dates discussed:    Additional Comments: Pt discharged home today. NO CM needs noted. Christinia Gully [place], RN 02/03/2015, 1:53 PM

## 2015-02-03 NOTE — Discharge Summary (Signed)
Physician Discharge Summary  Colleen Dixon TUU:[id number] DOB: 08-24-1950 DOA: 01/30/2015  PCP: Christoper Fabian, MD  Admit date: 01/30/2015 Discharge date: 02/03/2015  Time spent: 35 minutes  Recommendations for Outpatient Follow-up:  1. Follow up with Dr Mar Daring of urology as per arrangement.  2. Follow up with your PCP in 1-2 weeks.   Discharge Diagnoses:  Active Problems:   Diabetes mellitus type 2 in obese   Hydronephrosis   UTI (urinary tract infection)   Essential hypertension   Sepsis   Discharge Condition: improved.   Diet recommendation: Carb modified diet.   Filed Weights   01/30/15 1253 01/30/15 2009  Weight: 81.647 kg (180 lb) 82.509 kg (181 lb 14.4 oz)    History of present illness:  Patient was admitted by Dr Anastasio Champion for Hydronephrosis, UTI, and obstructing nephrolithiasis on January 30, 2015.  As per his H and P :  " Colleen Dixon is a 64 y.o. female  This is a 64 year old lady, diet controlled diabetic, hypertension, asthma, who presents with left lower quadrant abdominal pain that started 2 days ago and was worsening. She did not have any urinary complaints. She has had several episodes of vomiting associated with the abdominal pain. There is no diarrhea. Evaluation in the emergency room with CT scan of the abdomen and pelvis showed her to have left hydronephrosis with left obstructing urinary stone in the proximal left ureter. She was seen by urology and taken to the OR this evening and had Cystoscopy, placement of left double-J stent-24 cm x 6 French contour without string . The procedure went without complications and she is now postoperative on the medical floor. She is somewhat drowsy from the anesthesia. She does have a fever but does not appear to be septic/toxic.  Hospital Course: Patient was admitted into the hospital, and she was given IVF and IV Rocephin.  She was also seen in consultation with urology, and Dr Mar Daring performed cystoscopy with stent placement  of the left ureter.  Her culture grew Proteus, sensitive to Rocephin.  She was having temp spike, so it was switch temporarily to Merepenum, but after confirming with urine culture, it was switch back to Rocephin.  For her DM, she was given insulin and her BGs were under control.  Her urine grew Proteus, sensitive to both Rocephin and Cipro, and she will be discharged on another week of Cipro.  Dr Mar Daring will made arrangement for her follow up in consideration of [facility] for her stone.  She is anxious to go home, and is stable for discharge.  She no longer had any fever.  Thank you for allowing me to participate in her care.  She was recommended to follow up with her PCP for general medical care as well.    Procedures:  Cystoscopy with stent placement.   Consultations:  Urology: Dr Mar Daring.   Discharge Exam: Filed Vitals:   02/03/15 0617  BP: 105/88  Pulse: 75  Temp: 98.7 F (37.1 C)  Resp: 18    Discharge Instructions   Discharge Instructions    Diet - low sodium heart healthy    Complete by:  As directed      Discharge instructions    Complete by:  As directed   Please follow up with Dr Mar Daring of urology as scheduled.     Increase activity slowly    Complete by:  As directed           Current Discharge Medication List    START  taking these medications   Details  ciprofloxacin (CIPRO) 500 MG tablet Take 1 tablet (500 mg total) by mouth 2 (two) times daily. Qty: 14 tablet, Refills: 0      CONTINUE these medications which have NOT CHANGED   Details  albuterol (PROVENTIL HFA;VENTOLIN HFA) 108 (90 BASE) MCG/ACT inhaler Inhale 2 puffs into the lungs every 6 (six) hours as needed for wheezing or shortness of breath.    budesonide (PULMICORT) 180 MCG/ACT inhaler Inhale 1 puff into the lungs 2 (two) times daily.    Calcium Carbonate-Vitamin D (CALTRATE 600+D PO) Take 2 tablets by mouth daily.    fluticasone (FLONASE) 50 MCG/ACT nasal spray Place 1 spray into the nose  daily.    lisinopril-hydrochlorothiazide (PRINZIDE,ZESTORETIC) 20-25 MG per tablet Take 1 tablet by mouth daily.    naphazoline (NAPHCON) 0.1 % ophthalmic solution Place 1 drop into both eyes 4 (four) times daily as needed (for allergy eye relief).    traMADol (ULTRAM) 50 MG tablet Take 50 mg by mouth every 6 (six) hours as needed for pain.      STOP taking these medications     Camphor-Menthol-Methyl Sal (SALONPAS) 1.2-5.7-6.3 % PTCH      cetirizine (ZYRTEC) 10 MG tablet      montelukast (SINGULAIR) 10 MG tablet        Allergies  Allergen Reactions  . Aspirin Nausea Only      The results of significant diagnostics from this hospitalization (including imaging, microbiology, ancillary and laboratory) are listed below for reference.    Significant Diagnostic Studies: Ct Abdomen Pelvis W Contrast  01/30/2015   CLINICAL DATA:  Left lower quadrant pain.  EXAM: CT ABDOMEN AND PELVIS WITH CONTRAST  TECHNIQUE: Multidetector CT imaging of the abdomen and pelvis was performed using the standard protocol following bolus administration of intravenous contrast.  CONTRAST:  10mL OMNIPAQUE IOHEXOL 300 MG/ML SOLN, 171mL OMNIPAQUE IOHEXOL 300 MG/ML SOLN  COMPARISON:  May 04, 2013  FINDINGS: There is left hydronephrosis with left perinephric due to obstruction by a 5.3 x 10 mm stone in the proximal left ureter. The right kidney is normal.  There is mild diffuse low density of the liver. There is no focal liver lesion. The spleen, pancreas, gallbladder, adrenal glands, and aorta are normal. There is no small bowel obstruction or diverticulitis. The appendix is normal.  The bladder is normal. The uterus is not seen. There is mild atelectasis of bilateral lungs. Degenerative changes of the spine are noted.  IMPRESSION: There is left hydronephrosis with left perinephric due to obstruction by a 5.3 x 10 mm stone in the proximal left ureter.   Electronically Signed   By: Abelardo Diesel M.D.   On: 01/30/2015  15:52   Dg Chest Port 1 View  01/30/2015   CLINICAL DATA:  Left-sided pain and dizzy since Saturday.  EXAM: PORTABLE CHEST - 1 VIEW  COMPARISON:  May 04, 2013  FINDINGS: The mediastinal contour is normal. The heart size is enlarged. There is no focal infiltrate, pulmonary edema, or pleural effusion. The visualized skeletal structures are unremarkable.  IMPRESSION: No active cardiopulmonary disease.   Electronically Signed   By: Abelardo Diesel M.D.   On: 01/30/2015 17:25   Dg C-arm 1-60 Min-no Report  01/30/2015   CLINICAL DATA: infected left kidney stone   C-ARM 1-60 MINUTES  Fluoroscopy was utilized by the requesting physician.  No radiographic  interpretation.     Microbiology: Recent Results (from the past 240 hour(s))  Urine culture  Status: None   Collection Time: 01/30/15  4:25 PM  Result Value Ref Range Status   Specimen Description URINE, CLEAN CATCH  Final   Special Requests Normal  Final   Culture   Final    >=100,000 COLONIES/mL PROTEUS MIRABILIS Performed at [facility]    Report Status 02/02/2015 FINAL  Final   Organism ID, Bacteria PROTEUS MIRABILIS  Final      Susceptibility   Proteus mirabilis - MIC*    AMPICILLIN <=2 SENSITIVE Sensitive     CEFAZOLIN <=4 SENSITIVE Sensitive     CEFTRIAXONE <=1 SENSITIVE Sensitive     CIPROFLOXACIN <=0.25 SENSITIVE Sensitive     GENTAMICIN <=1 SENSITIVE Sensitive     IMIPENEM 2 SENSITIVE Sensitive     NITROFURANTOIN 128 RESISTANT Resistant     TRIMETH/SULFA <=20 SENSITIVE Sensitive     AMPICILLIN/SULBACTAM <=2 SENSITIVE Sensitive     PIP/TAZO <=4 SENSITIVE Sensitive     * >=100,000 COLONIES/mL PROTEUS MIRABILIS  Culture, blood (single)     Status: None (Preliminary result)   Collection Time: 01/30/15  9:02 PM  Result Value Ref Range Status   Specimen Description RIGHT ANTECUBITAL  Final   Special Requests   Final    BOTTLES DRAWN AEROBIC AND ANAEROBIC AEB 10CC ANA [place]   Culture NO GROWTH 3 DAYS  Final   Report  Status PENDING  Incomplete     Labs: Basic Metabolic Panel:  Recent Labs Lab 01/30/15 1318 01/31/15 0603 02/01/15 0622 02/02/15 0630 02/03/15 0615  NA 135 136 137 138 136  K 3.3* 3.3* 3.1* 3.7 4.0  CL 99* 101 103 106 103  CO2 25 27 25 25 24   GLUCOSE 131* 116* 105* 115* 100*  BUN 20 19 14 10 11   CREATININE 1.30* 1.24* 0.84 0.77 0.79  CALCIUM 9.6 8.3* 8.1* 8.5* 8.6*   Liver Function Tests:  Recent Labs Lab 01/30/15 1318 01/31/15 0603  AST 24 18  ALT 23 19  ALKPHOS 92 78  BILITOT 1.1 1.0  PROT 7.6 6.5  ALBUMIN 3.8 3.0*    Recent Labs Lab 01/30/15 1318  LIPASE 15*   No results for input(s): AMMONIA in the last 168 hours. CBC:  Recent Labs Lab 01/30/15 1318 01/31/15 0603 02/01/15 0622 02/02/15 0630 02/03/15 0615  WBC 25.5* 22.1* 15.8* 12.4* 12.2*  NEUTROABS 22.9*  --   --   --   --   HGB 14.0 12.3 11.8* 12.5 12.3  HCT 41.7 38.1 36.9 39.1 38.1  MCV 89.1 90.7 91.3 90.1 89.6  PLT 222 188 190 219 270   CBG:  Recent Labs Lab 02/02/15 1719 02/02/15 1827 02/02/15 2252 02/03/15 0739 02/03/15 1114  GLUCAP 65 124* 101* 89 114*   Signed:  Nilaya Bouie  [facility] 02/03/2015, 1:03 PM

## 2015-02-03 NOTE — Progress Notes (Signed)
Inpatient Diabetes Program Recommendations  AACE/ADA: New Consensus Statement on Inpatient Glycemic Control (2013)  Target Ranges:  Prepandial:   less than 140 mg/dL      Peak postprandial:   less than 180 mg/dL (1-2 hours)      Critically ill patients:  140 - 180 mg/dL   Review of Glycemic Control:  Results for Colleen Dixon, Colleen Dixon (MRN [id number]) as of 02/03/2015 12:13  Ref. Range 02/02/2015 17:19 02/02/2015 18:27 02/02/2015 22:52 02/03/2015 07:39 02/03/2015 11:14  Glucose-Capillary Latest Ref Range: 65-99 mg/dL 65 124 (H) 101 (H) 89 114 (H)   Diabetes history: Type 2 diabetes Outpatient Diabetes medications: None noted Current orders for Inpatient glycemic control:  Novolog resist tid with meals and HS  Please decrease Novolog correction to sensitive tid with meals. Note that patient has not required Novolog correction in 2 days.    Thanks, Adah Perl, RN, BC-ADM Inpatient Diabetes Coordinator Pager [phone] (8a-5p)

## 2015-02-03 NOTE — Progress Notes (Signed)
Discharge instructions given on medications,and follow up visits,patient verbalized understanding.Prescriptions sent to Pharmacy of choice documented on AVS. Accompanied by staff to an awaiting vehicle. 

## 2015-02-04 LAB — CULTURE, BLOOD (SINGLE): Culture: NO GROWTH

## 2015-02-07 ENCOUNTER — Ambulatory Visit (INDEPENDENT_AMBULATORY_CARE_PROVIDER_SITE_OTHER): Payer: BLUE CROSS/BLUE SHIELD | Admitting: Urology

## 2015-02-07 ENCOUNTER — Ambulatory Visit (HOSPITAL_COMMUNITY)
Admission: RE | Admit: 2015-02-07 | Discharge: 2015-02-07 | Disposition: A | Discharge status: Home / Self Care | Payer: BLUE CROSS/BLUE SHIELD | Source: Ambulatory Visit | Attending: Urology | Admitting: Urology

## 2015-02-07 ENCOUNTER — Other Ambulatory Visit: Payer: Self-pay | Admitting: Urology

## 2015-02-07 DIAGNOSIS — N200 Calculus of kidney: Secondary | ICD-10-CM | POA: Diagnosis present

## 2015-02-07 DIAGNOSIS — N201 Calculus of ureter: Secondary | ICD-10-CM

## 2015-02-07 DIAGNOSIS — N202 Calculus of kidney with calculus of ureter: Secondary | ICD-10-CM | POA: Diagnosis not present

## 2015-02-07 DIAGNOSIS — N136 Pyonephrosis: Secondary | ICD-10-CM

## 2015-02-15 ENCOUNTER — Other Ambulatory Visit: Payer: Self-pay | Admitting: Urology

## 2015-02-17 ENCOUNTER — Encounter (HOSPITAL_COMMUNITY): Payer: Self-pay | Admitting: *Deleted

## 2015-02-17 NOTE — H&P (Signed)
istory of Present Illness  This woman returns today for follow-up.  She was initially seen just over a week ago and had an urgent left double-J stent placement for an obstructing left proximal ureteral stone, 5 x 10 mm, with associated urinary tract infection.  She had been symptomatic for a couple of days before.  She was admitted to the hospital for antibiotic management postoperatively.  She did grow Proteus mirabilis which was pansensitive except to nitrofurantoin.    Past Medical History  1. History of arthritis (Z87.39)  2. History of asthma (Z87.09)  3. History of diabetes mellitus (Z86.39)  4. History of hypercholesterolemia (Z86.39)  5. History of hypertension (Z86.79)  6. History of Recurrent nephrolithiasis (N20.0)  Surgical History  1. History of Hysterectomy  Current Meds  1. Cipro 500 MG Oral Tablet;  Therapy: (Recorded:12Jul2016) to Recorded  2. Fluticasone Propionate 50 MCG/ACT Nasal Suspension;  Therapy: (Recorded:12Jul2016) to Recorded  3. Qvar 40 MCG/ACT Inhalation Aerosol Solution;  Therapy: (Recorded:12Jul2016) to Recorded  4. TraMADol HCl - 50 MG Oral Tablet;  Therapy: (Recorded:12Jul2016) to Recorded  Family History  1. Family history of Death : Mother, Father  Social History   Alcohol use (Z78.9)   Caffeine use (F15.90)   Married   Number of children   1 son 1 daughter   Tobacco use (Z72.0)   1/2 pack for 8 years. quit 20  Review of Systems  Genitourinary: urinary frequency, dysuria and nocturia, but no hematuria.    Vitals Vital Signs [Data Includes: Last 1 Day]  Recorded: 12Jul2016 09:27AM  Blood Pressure: 122 / 71 Temperature: 98 F Heart Rate: 82 Recorded: 12Jul2016 09:23AM  Height: 5 ft 3 in Weight: 180 lb  BMI Calculated: 31.89 BSA Calculated: 1.85  Physical Exam Constitutional: Well nourished and well developed . No acute distress.  ENT:. The ears and nose are normal in appearance.  Neck: The appearance of the neck is  normal and no neck mass is present.  Pulmonary: No respiratory distress and normal respiratory rhythm and effort.  Cardiovascular: Heart rate and rhythm are normal . No peripheral edema.  Abdomen: The abdomen is soft and nontender. No masses are palpated. No CVA tenderness. No hernias are palpable. No hepatosplenomegaly noted.  Skin: Normal skin turgor, no visible rash and no visible skin lesions.  Neuro/Psych:. Mood and affect are appropriate.    Assessment  1. Acute pyonephrosis (N13.6)  2. Ureteral calculus, left (N20.1)   1.  Left upper ureteral stone, 5 x 10 mm, with associated UTI.  She is status post urgent stent placement on 01/30/2015.  She is improving  2.  Proteus UTI associated with stone   Plan Acute pyonephrosis   1. Start: Trimethoprim 100 MG Oral Tablet; TAKE 1 TABLET BY MOUTH ATBEDTIME Ureteral calculus, left   2. KUB; Status:Hold For - Appointment,Date of Service; Requested for:12Jul2016;   3. Follow-up Schedule Surgery Office  Follow-up  Status: Hold For - Appointment   Requested for: 12Jul2016 Unlinked   4. UA With REFLEX; [Do Not Release]; Status:Hold For - Specimen/Data  Collection,Appointment; Requested for:08Jul2016;   Discussion/Summary  1.  KUB will be obtained today  2.  I discussed further management with her-as she has a solitary left upper ureteral stone with a stent, I think lithotripsy is an adequate treatment for her.  I discussed this procedure with her in depth, as well as follow-up in the office  3.  We will get that procedure scheduled-I have written her to be off of  work until the 25th of this month, at which time she starts a 2 week vacation   Electronics engineer signed by : Franchot Gallo, M.D.; Feb 07 2015  9:51AM EST

## 2015-02-20 ENCOUNTER — Ambulatory Visit (HOSPITAL_COMMUNITY): Payer: BLUE CROSS/BLUE SHIELD

## 2015-02-20 ENCOUNTER — Ambulatory Visit (HOSPITAL_COMMUNITY)
Admission: RE | Admit: 2015-02-20 | Discharge: 2015-02-20 | Disposition: A | Discharge status: Home / Self Care | Payer: BLUE CROSS/BLUE SHIELD | Source: Ambulatory Visit | Attending: Urology | Admitting: Urology

## 2015-02-20 ENCOUNTER — Encounter (HOSPITAL_COMMUNITY): Payer: Self-pay

## 2015-02-20 ENCOUNTER — Encounter (HOSPITAL_COMMUNITY)
Admission: RE | Disposition: A | Discharge status: Home / Self Care | Payer: Self-pay | Source: Ambulatory Visit | Attending: Urology

## 2015-02-20 DIAGNOSIS — Z79899 Other long term (current) drug therapy: Secondary | ICD-10-CM | POA: Diagnosis not present

## 2015-02-20 DIAGNOSIS — E119 Type 2 diabetes mellitus without complications: Secondary | ICD-10-CM | POA: Diagnosis not present

## 2015-02-20 DIAGNOSIS — E780 Pure hypercholesterolemia: Secondary | ICD-10-CM | POA: Insufficient documentation

## 2015-02-20 DIAGNOSIS — M1990 Unspecified osteoarthritis, unspecified site: Secondary | ICD-10-CM | POA: Diagnosis not present

## 2015-02-20 DIAGNOSIS — N201 Calculus of ureter: Secondary | ICD-10-CM | POA: Insufficient documentation

## 2015-02-20 DIAGNOSIS — N136 Pyonephrosis: Secondary | ICD-10-CM | POA: Insufficient documentation

## 2015-02-20 DIAGNOSIS — J45909 Unspecified asthma, uncomplicated: Secondary | ICD-10-CM | POA: Diagnosis not present

## 2015-02-20 DIAGNOSIS — Z792 Long term (current) use of antibiotics: Secondary | ICD-10-CM | POA: Diagnosis not present

## 2015-02-20 DIAGNOSIS — Z87442 Personal history of urinary calculi: Secondary | ICD-10-CM | POA: Insufficient documentation

## 2015-02-20 DIAGNOSIS — B964 Proteus (mirabilis) (morganii) as the cause of diseases classified elsewhere: Secondary | ICD-10-CM | POA: Insufficient documentation

## 2015-02-20 DIAGNOSIS — Z7951 Long term (current) use of inhaled steroids: Secondary | ICD-10-CM | POA: Diagnosis not present

## 2015-02-20 DIAGNOSIS — I10 Essential (primary) hypertension: Secondary | ICD-10-CM | POA: Insufficient documentation

## 2015-02-20 DIAGNOSIS — N390 Urinary tract infection, site not specified: Secondary | ICD-10-CM | POA: Diagnosis not present

## 2015-02-20 HISTORY — DX: Unspecified osteoarthritis, unspecified site: M19.90

## 2015-02-20 HISTORY — DX: Chronic kidney disease, unspecified: N18.9

## 2015-02-20 LAB — GLUCOSE, CAPILLARY: Glucose-Capillary: 93 mg/dL (ref 65–99)

## 2015-02-20 SURGERY — LITHOTRIPSY, ESWL
Anesthesia: LOCAL | Laterality: Left

## 2015-02-20 MED ORDER — SODIUM CHLORIDE 0.9 % IV SOLN
INTRAVENOUS | Status: DC
  Administered 2015-02-20: 09:00:00 via INTRAVENOUS

## 2015-02-20 MED ORDER — LEVOFLOXACIN 500 MG PO TABS
500.0000 mg | ORAL_TABLET | ORAL | Status: AC
  Administered 2015-02-20: 500 mg via ORAL
  Filled 2015-02-20: qty 1

## 2015-02-20 MED ORDER — DIPHENHYDRAMINE HCL 25 MG PO CAPS
25.0000 mg | ORAL_CAPSULE | ORAL | Status: AC
  Administered 2015-02-20: 25 mg via ORAL
  Filled 2015-02-20: qty 1

## 2015-02-20 MED ORDER — ACETAMINOPHEN 650 MG RE SUPP
650.0000 mg | RECTAL | Status: DC | PRN
  Filled 2015-02-20: qty 1

## 2015-02-20 MED ORDER — SODIUM CHLORIDE 0.9 % IJ SOLN: 3.0000 mL | Freq: Two times a day (BID) | INTRAMUSCULAR | Status: DC

## 2015-02-20 MED ORDER — FENTANYL CITRATE (PF) 100 MCG/2ML IJ SOLN: 25.0000 ug | INTRAMUSCULAR | Status: DC | PRN

## 2015-02-20 MED ORDER — ACETAMINOPHEN 325 MG PO TABS: 650.0000 mg | ORAL_TABLET | ORAL | Status: DC | PRN

## 2015-02-20 MED ORDER — OXYCODONE HCL 5 MG PO TABS: 5.0000 mg | ORAL_TABLET | ORAL | Status: DC | PRN

## 2015-02-20 MED ORDER — SODIUM CHLORIDE 0.9 % IV SOLN: 250.0000 mL | INTRAVENOUS | Status: DC | PRN

## 2015-02-20 MED ORDER — SODIUM CHLORIDE 0.9 % IJ SOLN: 3.0000 mL | INTRAMUSCULAR | Status: DC | PRN

## 2015-02-20 MED ORDER — DIAZEPAM 5 MG PO TABS
10.0000 mg | ORAL_TABLET | ORAL | Status: AC
  Administered 2015-02-20: 10 mg via ORAL
  Filled 2015-02-20: qty 2

## 2015-02-20 MED ORDER — OXYCODONE-ACETAMINOPHEN 5-325 MG PO TABS: 1.0000 | ORAL_TABLET | ORAL | Status: DC | PRN

## 2015-02-20 NOTE — Discharge Instructions (Signed)

## 2015-02-20 NOTE — Interval H&P Note (Signed)
History and Physical Interval Note: no change in stone location.  Pt needs pain med.  02/20/2015 11:05 AM  Colleen Dixon  has presented today for surgery, with the diagnosis of LEFT URETERAL STONE  The various methods of treatment have been discussed with the patient and family. After consideration of risks, benefits and other options for treatment, the patient has consented to  Procedure(s): LEFT EXTRACORPOREAL SHOCK WAVE LITHOTRIPSY (ESWL) (Left) as a surgical intervention .  The patient's history has been reviewed, patient examined, no change in status, stable for surgery.  I have reviewed the patient's chart and labs.  Questions were answered to the patient's satisfaction.     Channon Brougher J

## 2015-03-14 ENCOUNTER — Ambulatory Visit (HOSPITAL_COMMUNITY)
Admission: RE | Admit: 2015-03-14 | Discharge: 2015-03-14 | Disposition: A | Discharge status: Home / Self Care | Payer: BLUE CROSS/BLUE SHIELD | Source: Ambulatory Visit | Attending: Urology | Admitting: Urology

## 2015-03-14 ENCOUNTER — Other Ambulatory Visit: Payer: Self-pay | Admitting: Urology

## 2015-03-14 ENCOUNTER — Ambulatory Visit (INDEPENDENT_AMBULATORY_CARE_PROVIDER_SITE_OTHER): Payer: BLUE CROSS/BLUE SHIELD | Admitting: Urology

## 2015-03-14 DIAGNOSIS — N201 Calculus of ureter: Secondary | ICD-10-CM | POA: Diagnosis not present

## 2015-03-27 ENCOUNTER — Other Ambulatory Visit: Payer: Self-pay | Admitting: Urology

## 2015-03-27 DIAGNOSIS — N201 Calculus of ureter: Secondary | ICD-10-CM

## 2015-04-27 ENCOUNTER — Ambulatory Visit (HOSPITAL_COMMUNITY)
Admission: RE | Admit: 2015-04-27 | Discharge: 2015-04-27 | Disposition: A | Discharge status: Home / Self Care | Payer: BLUE CROSS/BLUE SHIELD | Source: Ambulatory Visit | Attending: Urology | Admitting: Urology

## 2015-04-27 ENCOUNTER — Other Ambulatory Visit: Payer: Self-pay | Admitting: Urology

## 2015-04-27 DIAGNOSIS — Z9889 Other specified postprocedural states: Secondary | ICD-10-CM | POA: Diagnosis not present

## 2015-04-27 DIAGNOSIS — N200 Calculus of kidney: Secondary | ICD-10-CM

## 2015-04-27 DIAGNOSIS — R934 Abnormal findings on diagnostic imaging of urinary organs: Secondary | ICD-10-CM | POA: Insufficient documentation

## 2015-04-27 DIAGNOSIS — N201 Calculus of ureter: Secondary | ICD-10-CM | POA: Insufficient documentation

## 2015-05-02 ENCOUNTER — Ambulatory Visit (INDEPENDENT_AMBULATORY_CARE_PROVIDER_SITE_OTHER): Payer: Self-pay | Admitting: Urology

## 2015-05-02 DIAGNOSIS — N200 Calculus of kidney: Secondary | ICD-10-CM

## 2016-05-01 ENCOUNTER — Ambulatory Visit (HOSPITAL_COMMUNITY)
Admission: RE | Admit: 2016-05-01 | Discharge: 2016-05-01 | Disposition: A | Discharge status: Home / Self Care | Payer: BLUE CROSS/BLUE SHIELD | Source: Ambulatory Visit | Attending: Urology | Admitting: Urology

## 2016-05-01 ENCOUNTER — Other Ambulatory Visit: Payer: Self-pay | Admitting: Urology

## 2016-05-01 DIAGNOSIS — N200 Calculus of kidney: Secondary | ICD-10-CM

## 2016-05-01 DIAGNOSIS — N201 Calculus of ureter: Secondary | ICD-10-CM | POA: Diagnosis present

## 2016-05-07 ENCOUNTER — Ambulatory Visit (INDEPENDENT_AMBULATORY_CARE_PROVIDER_SITE_OTHER): Payer: BLUE CROSS/BLUE SHIELD | Admitting: Urology

## 2016-05-07 DIAGNOSIS — N200 Calculus of kidney: Secondary | ICD-10-CM

## 2016-09-12 IMAGING — CT CT ABD-PELV W/ CM
2 of 5 series · 17 of 46 positions shown, 19 images · IV contrast (Omnipaque 300)
Comparison: May 04, 2013

CLINICAL DATA: Left lower quadrant pain.

EXAM:
CT ABDOMEN AND PELVIS WITH CONTRAST
TECHNIQUE: Multidetector CT imaging of the abdomen and pelvis was performed
using the standard protocol following bolus administration of
intravenous contrast.
CONTRAST:  50mL OMNIPAQUE IOHEXOL 300 MG/ML SOLN, 100mL OMNIPAQUE
IOHEXOL 300 MG/ML SOLN

[Series 2: abd_pel_with 5.0 b40f · axial · 0.75mm/px · z∈[+612,+1002]mm · 14 of 90 slices shown, 16 images]
[im 6/90  soft-tissue]
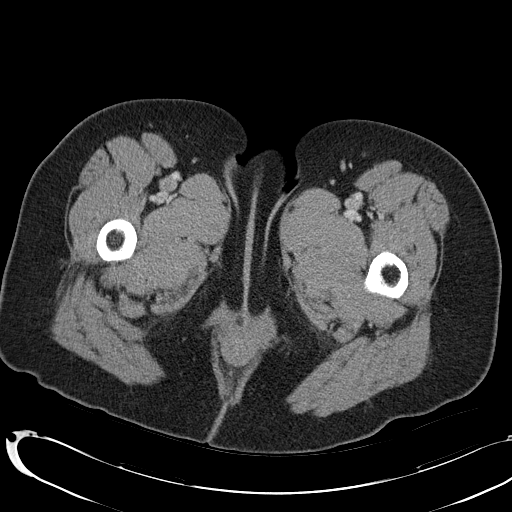
[im 6/90  bone]
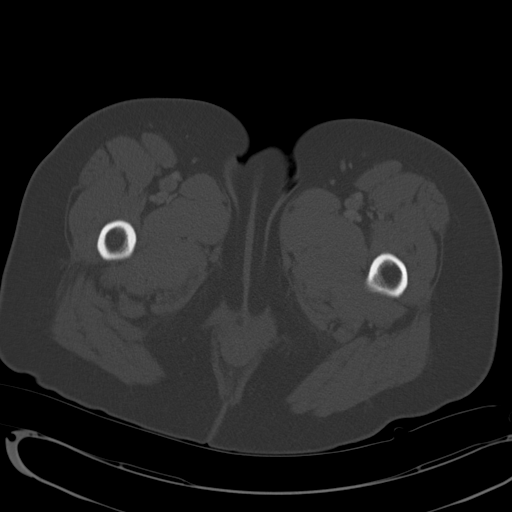
[im 11/90  soft-tissue]
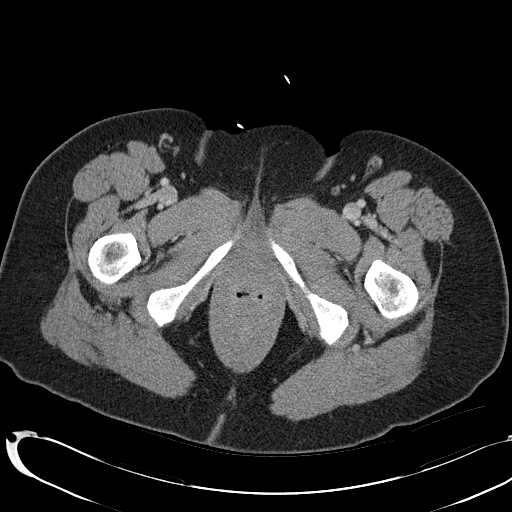
[im 16/90  soft-tissue]
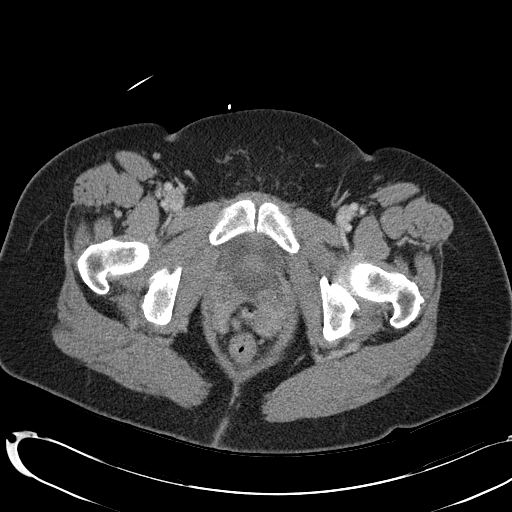
[im 27/90  soft-tissue]
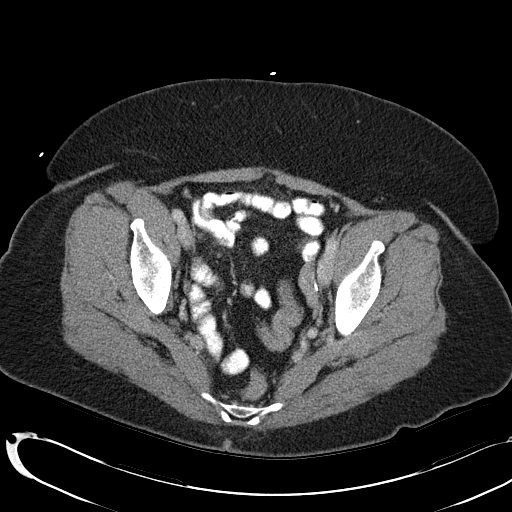
[im 32/90  soft-tissue]
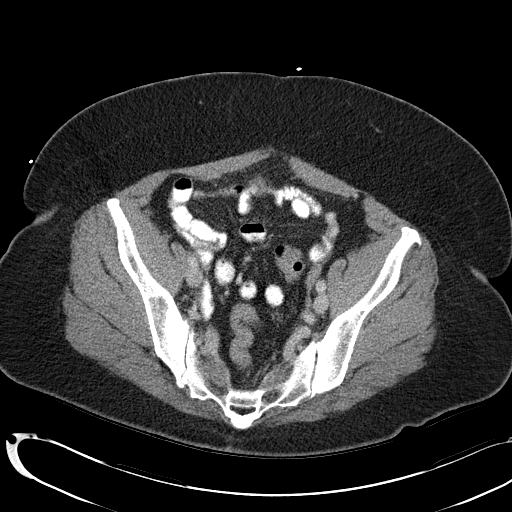
[im 37/90  soft-tissue]
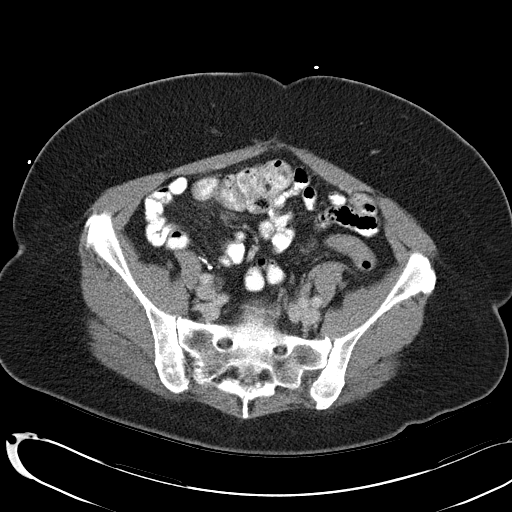
[im 42/90  soft-tissue]
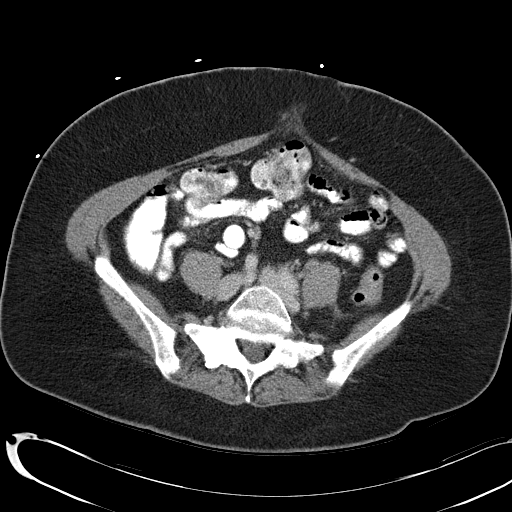
[im 48/90  soft-tissue]
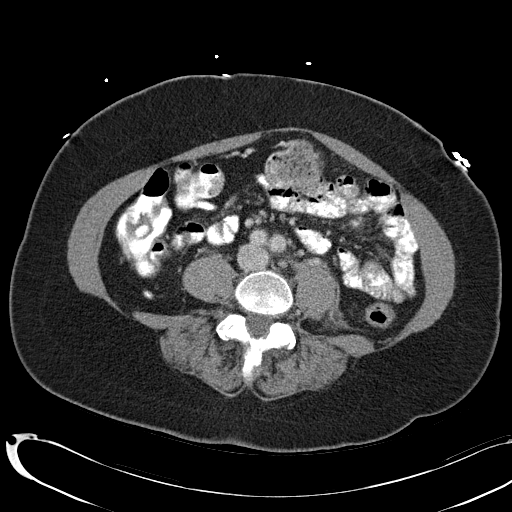
[im 53/90  soft-tissue]
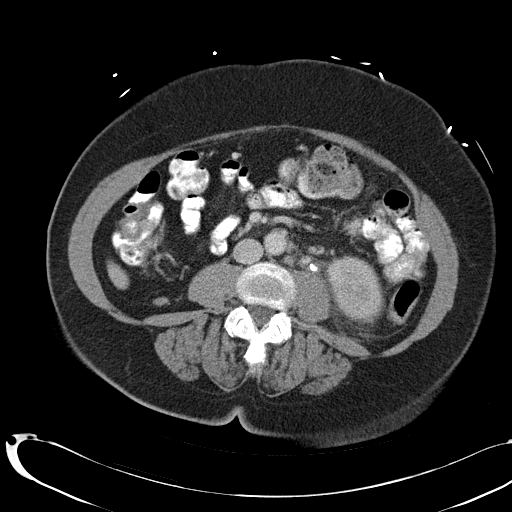
[im 53/90  bone]
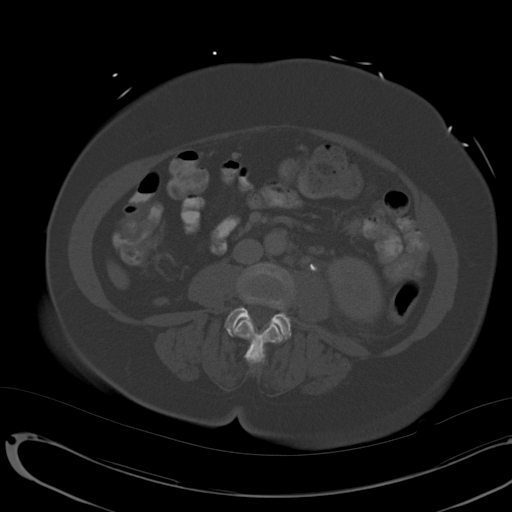
[im 58/90  soft-tissue]
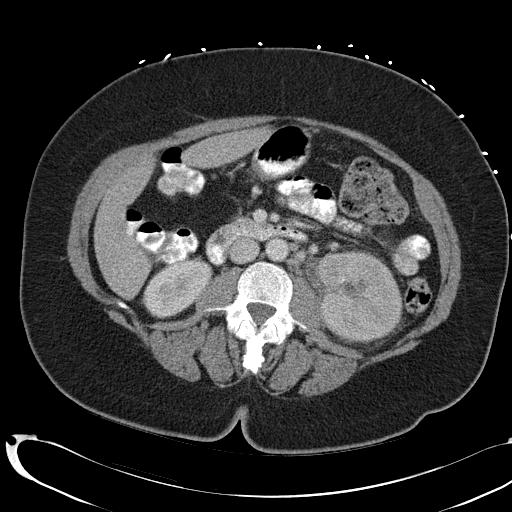
[im 69/90  soft-tissue]
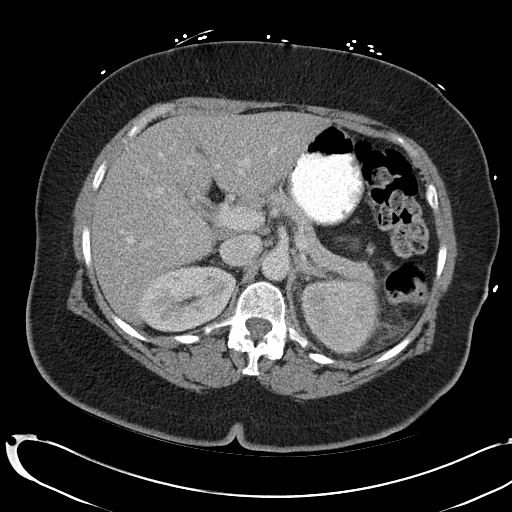
[im 74/90  soft-tissue]
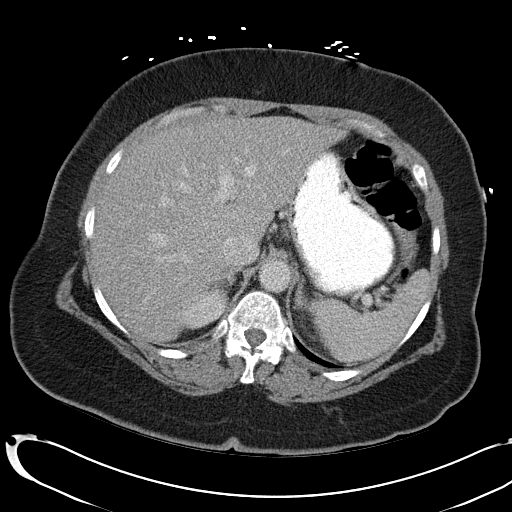
[im 79/90  soft-tissue]
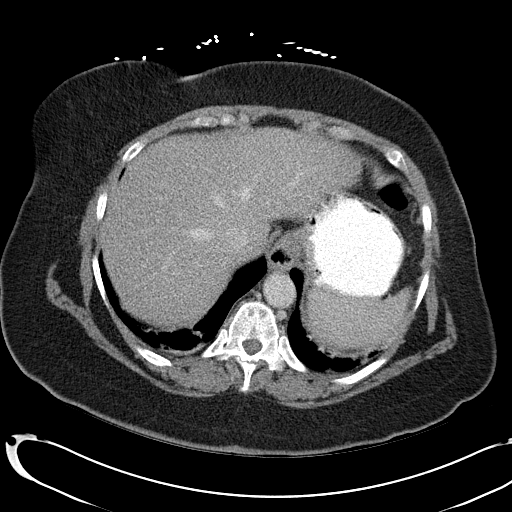
[im 84/90  soft-tissue]
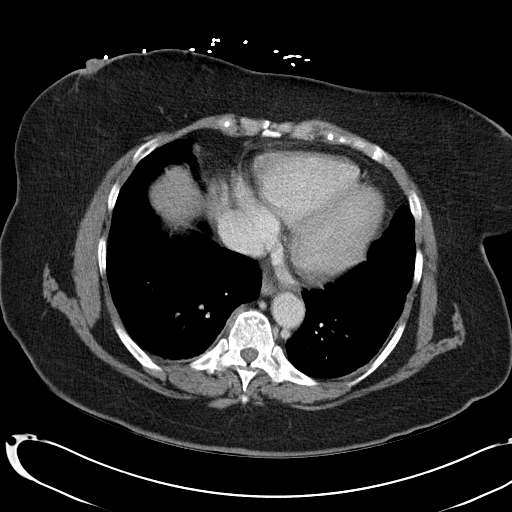

[Series 4: abd_pel_with 3.0 spo · coronal · 0.74mm/px · 3 of 98 slices shown]
[im 33/98  soft-tissue]
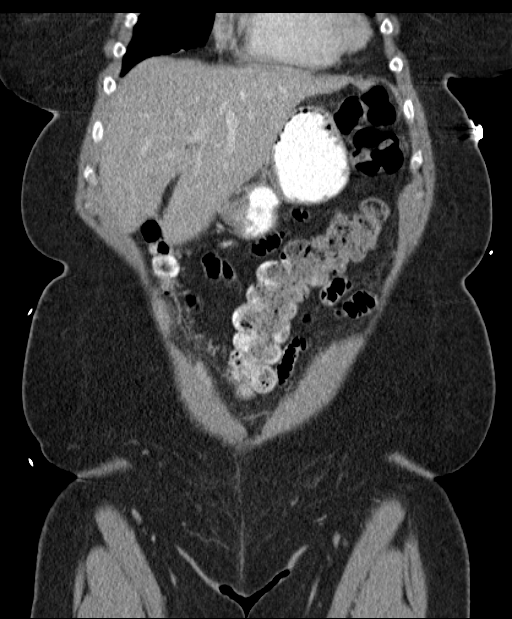
[im 44/98  soft-tissue]
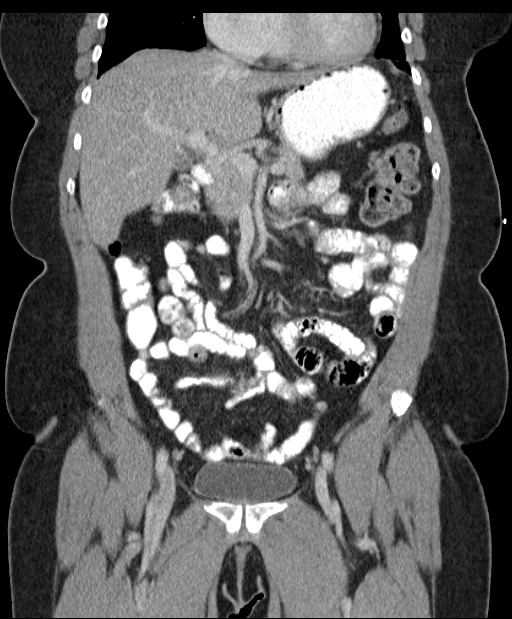
[im 54/98  soft-tissue]
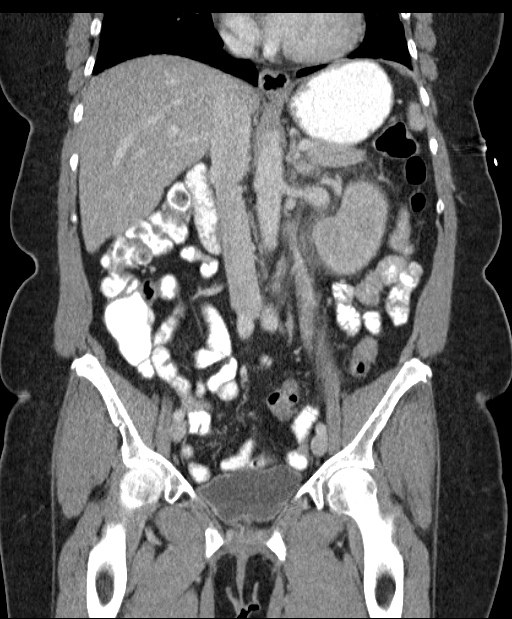

[17 of 46 positions shown; findings below may reference images not displayed]

FINDINGS: There is left hydronephrosis with left perinephric due to
obstruction by a 5.3 x 10 mm stone in the proximal left ureter. The
right kidney is normal.

There is mild diffuse low density of the liver. There is no focal
liver lesion. The spleen, pancreas, gallbladder, adrenal glands, and
aorta are normal. There is no small bowel obstruction or
diverticulitis. The appendix is normal.

The bladder is normal. The uterus is not seen. There is mild
atelectasis of bilateral lungs. Degenerative changes of the spine
are noted.
IMPRESSION: There is left hydronephrosis with left perinephric due to
obstruction by a 5.3 x 10 mm stone in the proximal left ureter.

## 2016-09-12 IMAGING — CR DG CHEST 1V PORT
1 series · 1 of 1 positions shown · non-contrast
Comparison: May 04, 2013

CLINICAL DATA: Left-sided pain and dizzy since [REDACTED].

EXAM:
PORTABLE CHEST - 1 VIEW

[ap portable]
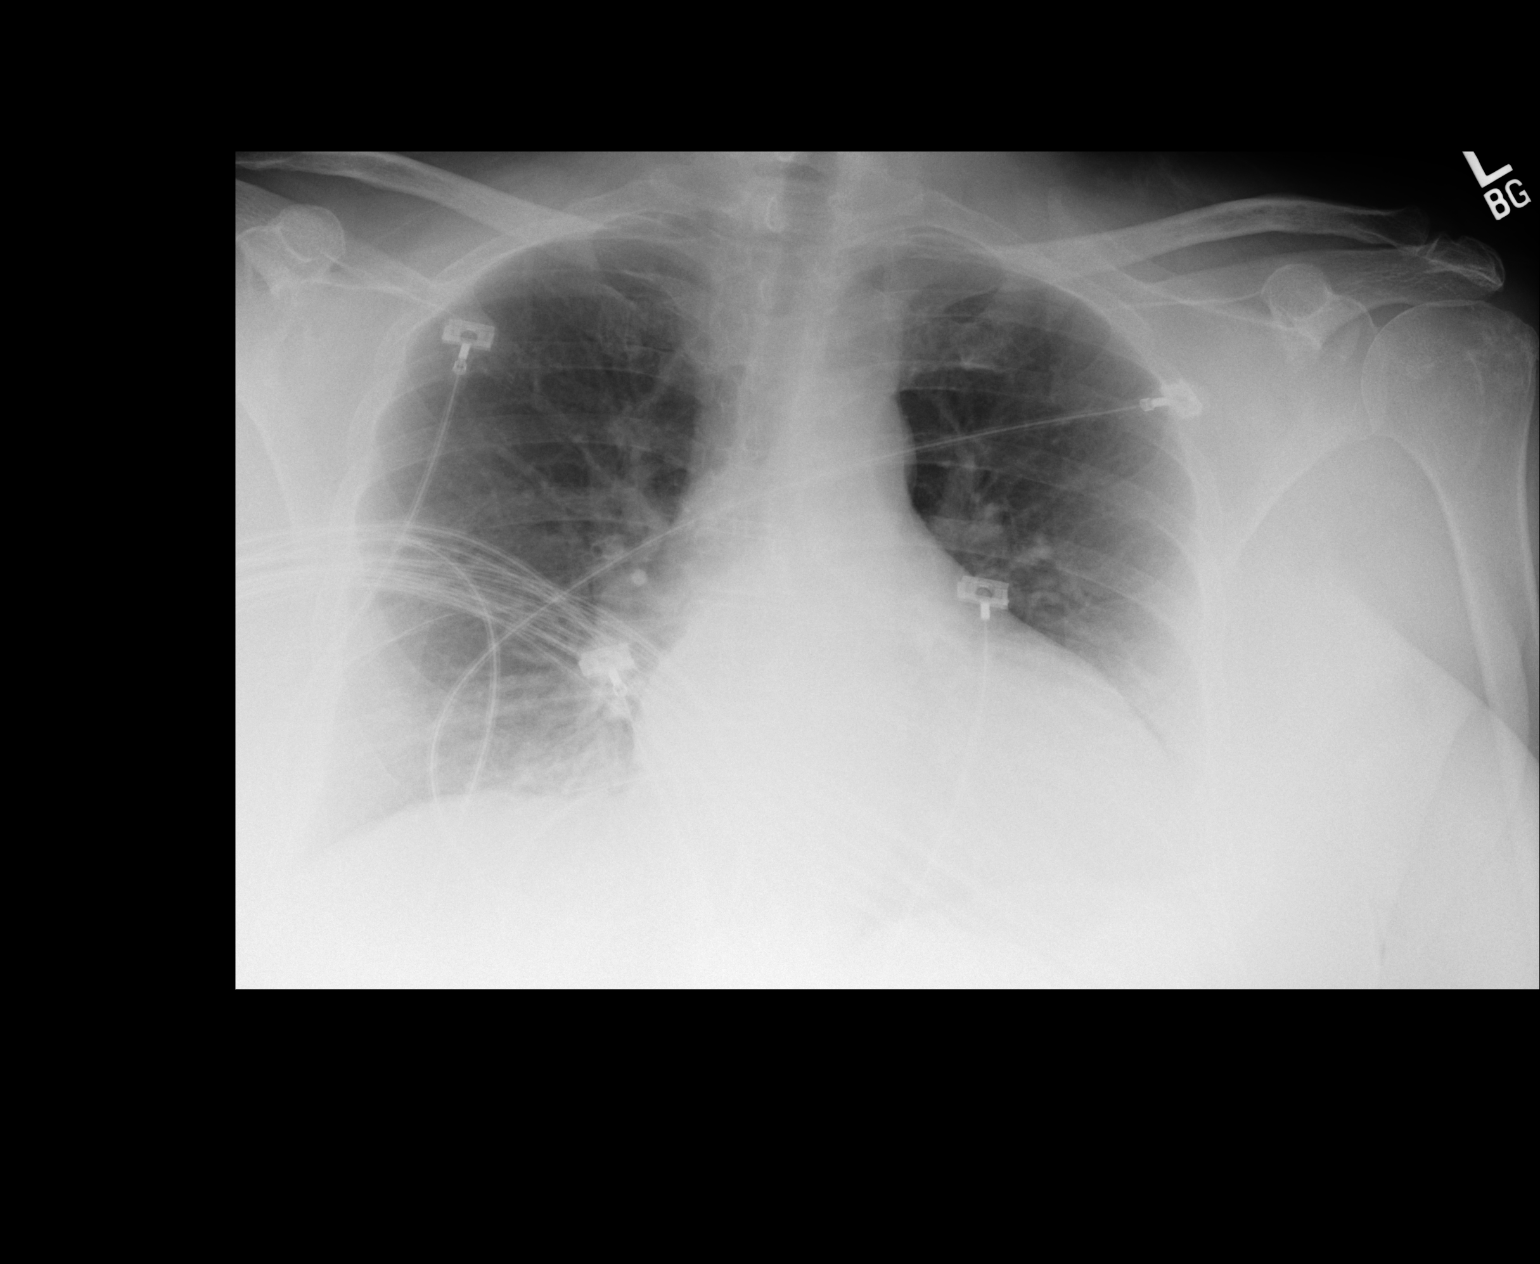

[1 of 1 positions shown; findings below may reference images not displayed]

FINDINGS: The mediastinal contour is normal. The heart size is enlarged. There
is no focal infiltrate, pulmonary edema, or pleural effusion. The
visualized skeletal structures are unremarkable.
IMPRESSION: No active cardiopulmonary disease.

## 2016-11-08 ENCOUNTER — Other Ambulatory Visit (HOSPITAL_COMMUNITY): Payer: Self-pay | Admitting: Family Medicine

## 2016-11-08 DIAGNOSIS — Z1231 Encounter for screening mammogram for malignant neoplasm of breast: Secondary | ICD-10-CM

## 2016-11-15 ENCOUNTER — Ambulatory Visit (HOSPITAL_COMMUNITY)
Admission: RE | Admit: 2016-11-15 | Discharge: 2016-11-15 | Disposition: A | Discharge status: Home / Self Care | Payer: BLUE CROSS/BLUE SHIELD | Source: Ambulatory Visit | Attending: Family Medicine | Admitting: Family Medicine

## 2016-11-15 DIAGNOSIS — Z1231 Encounter for screening mammogram for malignant neoplasm of breast: Secondary | ICD-10-CM | POA: Insufficient documentation

## 2017-03-12 ENCOUNTER — Ambulatory Visit (HOSPITAL_COMMUNITY)
Admission: RE | Admit: 2017-03-12 | Discharge: 2017-03-12 | Disposition: A | Discharge status: Home / Self Care | Payer: BLUE CROSS/BLUE SHIELD | Source: Ambulatory Visit | Attending: Family Medicine | Admitting: Family Medicine

## 2017-03-12 ENCOUNTER — Other Ambulatory Visit (HOSPITAL_COMMUNITY): Payer: Self-pay | Admitting: Family Medicine

## 2017-03-12 DIAGNOSIS — R0609 Other forms of dyspnea: Secondary | ICD-10-CM | POA: Diagnosis present

## 2017-03-12 DIAGNOSIS — R918 Other nonspecific abnormal finding of lung field: Secondary | ICD-10-CM | POA: Diagnosis not present

## 2017-03-12 DIAGNOSIS — I10 Essential (primary) hypertension: Secondary | ICD-10-CM | POA: Diagnosis not present

## 2017-03-12 DIAGNOSIS — J45909 Unspecified asthma, uncomplicated: Secondary | ICD-10-CM | POA: Insufficient documentation

## 2017-05-22 ENCOUNTER — Ambulatory Visit (HOSPITAL_COMMUNITY)
Admission: RE | Admit: 2017-05-22 | Discharge: 2017-05-22 | Disposition: A | Discharge status: Home / Self Care | Payer: Medicare Other | Source: Ambulatory Visit | Attending: Family Medicine | Admitting: Family Medicine

## 2017-05-22 ENCOUNTER — Other Ambulatory Visit (HOSPITAL_COMMUNITY): Payer: Self-pay | Admitting: Family Medicine

## 2017-05-22 DIAGNOSIS — M25441 Effusion, right hand: Secondary | ICD-10-CM | POA: Insufficient documentation

## 2017-05-22 DIAGNOSIS — M1990 Unspecified osteoarthritis, unspecified site: Secondary | ICD-10-CM

## 2017-07-30 ENCOUNTER — Ambulatory Visit (HOSPITAL_COMMUNITY)
Admission: RE | Admit: 2017-07-30 | Discharge: 2017-07-30 | Disposition: A | Discharge status: Home / Self Care | Payer: Medicare Other | Source: Ambulatory Visit | Attending: Family Medicine | Admitting: Family Medicine

## 2017-07-30 ENCOUNTER — Other Ambulatory Visit (HOSPITAL_COMMUNITY): Payer: Self-pay | Admitting: Family Medicine

## 2017-07-30 DIAGNOSIS — M47898 Other spondylosis, sacral and sacrococcygeal region: Secondary | ICD-10-CM | POA: Diagnosis not present

## 2017-07-30 DIAGNOSIS — M545 Low back pain: Secondary | ICD-10-CM | POA: Insufficient documentation

## 2017-07-30 DIAGNOSIS — M47896 Other spondylosis, lumbar region: Secondary | ICD-10-CM | POA: Insufficient documentation

## 2017-07-30 DIAGNOSIS — M25561 Pain in right knee: Secondary | ICD-10-CM | POA: Insufficient documentation

## 2017-07-30 DIAGNOSIS — R52 Pain, unspecified: Secondary | ICD-10-CM

## 2017-07-30 DIAGNOSIS — M47897 Other spondylosis, lumbosacral region: Secondary | ICD-10-CM | POA: Insufficient documentation

## 2017-07-30 DIAGNOSIS — M533 Sacrococcygeal disorders, not elsewhere classified: Secondary | ICD-10-CM | POA: Insufficient documentation

## 2017-07-30 DIAGNOSIS — M1711 Unilateral primary osteoarthritis, right knee: Secondary | ICD-10-CM | POA: Insufficient documentation

## 2017-10-28 ENCOUNTER — Other Ambulatory Visit (HOSPITAL_COMMUNITY): Payer: Self-pay | Admitting: Family Medicine

## 2017-10-28 DIAGNOSIS — Z1231 Encounter for screening mammogram for malignant neoplasm of breast: Secondary | ICD-10-CM

## 2017-11-26 ENCOUNTER — Encounter (HOSPITAL_COMMUNITY): Payer: Self-pay

## 2017-11-26 ENCOUNTER — Ambulatory Visit (HOSPITAL_COMMUNITY)
Admission: RE | Admit: 2017-11-26 | Discharge: 2017-11-26 | Disposition: A | Discharge status: Home / Self Care | Payer: Medicare Other | Source: Ambulatory Visit | Attending: Family Medicine | Admitting: Family Medicine

## 2017-11-26 DIAGNOSIS — R928 Other abnormal and inconclusive findings on diagnostic imaging of breast: Secondary | ICD-10-CM | POA: Diagnosis not present

## 2017-11-26 DIAGNOSIS — Z1231 Encounter for screening mammogram for malignant neoplasm of breast: Secondary | ICD-10-CM | POA: Diagnosis present

## 2017-12-08 ENCOUNTER — Other Ambulatory Visit (HOSPITAL_COMMUNITY): Payer: Self-pay | Admitting: Family Medicine

## 2017-12-08 DIAGNOSIS — R928 Other abnormal and inconclusive findings on diagnostic imaging of breast: Secondary | ICD-10-CM

## 2017-12-16 ENCOUNTER — Ambulatory Visit (HOSPITAL_COMMUNITY)
Admission: RE | Admit: 2017-12-16 | Discharge: 2017-12-16 | Disposition: A | Discharge status: Home / Self Care | Payer: Medicare Other | Source: Ambulatory Visit | Attending: Family Medicine | Admitting: Family Medicine

## 2017-12-16 DIAGNOSIS — R928 Other abnormal and inconclusive findings on diagnostic imaging of breast: Secondary | ICD-10-CM | POA: Diagnosis not present

## 2018-05-28 ENCOUNTER — Encounter: Payer: Self-pay | Admitting: Internal Medicine

## 2018-06-29 ENCOUNTER — Ambulatory Visit (INDEPENDENT_AMBULATORY_CARE_PROVIDER_SITE_OTHER): Payer: Self-pay

## 2018-06-29 DIAGNOSIS — Z86010 Personal history of colonic polyps: Secondary | ICD-10-CM

## 2018-06-29 NOTE — Progress Notes (Signed)
Gastroenterology Pre-Procedure Review  Request Date:06/29/18 Requesting Physician: 5 year recall last tcs- RMR- 06/28/13- tubular adenoma  PATIENT REVIEW QUESTIONS: The patient responded to the following health history questions as indicated:   Pt stated she had an episode this past Thursday of Nausea, vomiting and diarrhea after eating out for Thanksgiving. She has had 1-2 episodes of diarrhea since Thursday. She is not sure if it was due to something she ate at the restaurant or not. She reports lower abd pain, diarrhea, dark red blood in mucous.  She stated she has had symptoms similar to this prior to her last tcs and this has happened to her 1-2 times since her tcs in 2014. Does she need an ov or can we schedule tcs?   1. Diabetes Melitis: no 2. Joint replacements in the past 12 months: no 3. Major health problems in the past 3 months: no 4. Has an artificial valve or MVP: no 5. Has a defibrillator: no 6. Has been advised in past to take antibiotics in advance of a procedure like teeth cleaning: no 7. Family history of colon cancer: yes (maternal aunt, maternal uncle)  29. Alcohol Use: no 9. History of sleep apnea: no  10. History of coronary artery or other vascular stents placed within the last 12 months: no 11. History of any prior anesthesia complications: no    MEDICATIONS & ALLERGIES:    Patient reports the following regarding taking any blood thinners:   Plavix? no Aspirin? yes (81mg ) Coumadin? no Brilinta? no Xarelto? no Eliquis? no Pradaxa? no Savaysa? no Effient? no  Patient confirms/reports the following medications:  Current Outpatient Medications  Medication Sig Dispense Refill  . acetaminophen (TYLENOL) 650 MG CR tablet Take 650 mg by mouth at bedtime as needed for pain.    Marland Kitchen albuterol (PROVENTIL HFA;VENTOLIN HFA) 108 (90 Base) MCG/ACT inhaler Inhale into the lungs every 6 (six) hours as needed for wheezing or shortness of breath.    Marland Kitchen amLODipine (NORVASC) 5 MG  tablet daily.  3  . aspirin 81 MG tablet Take 81 mg by mouth daily.    . cetirizine (ZYRTEC) 10 MG tablet Take 10 mg by mouth daily.    . Flaxseed, Linseed, (FLAXSEED OIL PO) Take 1,000 mg by mouth.    Marland Kitchen lisinopril-hydrochlorothiazide (PRINZIDE,ZESTORETIC) 20-25 MG per tablet Take 1 tablet by mouth daily.    . Multiple Vitamin (MULTIVITAMIN) tablet Take 1 tablet by mouth daily.    . Vitamins-Lipotropics (LIPO-FLAVONOID PLUS PO) Take by mouth.     No current facility-administered medications for this visit.     Patient confirms/reports the following allergies:  Allergies  Allergen Reactions  . Aspirin Nausea Only    No orders of the defined types were placed in this encounter.   AUTHORIZATION INFORMATION Primary Insurance: medicare,  ID #: [id number] Pre-Cert / Josem Kaufmann required: no  SCHEDULE INFORMATION: Procedure has been scheduled as follows:  Date: , Time:  Location:   This Gastroenterology Pre-Precedure Review Form is being routed to the following provider(s): Walden Field NP

## 2018-06-30 NOTE — Progress Notes (Signed)
She should probably have an OV for symptoms and can be scheduled (if appropriate) at that time.

## 2018-06-30 NOTE — Progress Notes (Signed)
Called and informed pt, ov scheduled for 07/04/19 with RMR.

## 2018-07-03 ENCOUNTER — Encounter: Payer: Self-pay | Admitting: *Deleted

## 2018-07-03 ENCOUNTER — Encounter: Payer: Self-pay | Admitting: Internal Medicine

## 2018-07-03 ENCOUNTER — Ambulatory Visit (INDEPENDENT_AMBULATORY_CARE_PROVIDER_SITE_OTHER): Payer: Medicare Other | Admitting: Internal Medicine

## 2018-07-03 ENCOUNTER — Other Ambulatory Visit: Payer: Self-pay | Admitting: *Deleted

## 2018-07-03 VITALS — BP 151/86 | HR 91 | Temp 97.7°F | Ht 62.0 in | Wt 192.6 lb

## 2018-07-03 DIAGNOSIS — Z86010 Personal history of colonic polyps: Secondary | ICD-10-CM

## 2018-07-03 MED ORDER — NA SULFATE-K SULFATE-MG SULF 17.5-3.13-1.6 GM/177ML PO SOLN: 1.0000 | ORAL | 0 refills | Status: AC

## 2018-07-03 NOTE — Progress Notes (Signed)
Primary Care Physician:  Christoper Fabian, MD Primary Gastroenterologist:  Dr. Gala Romney  Pre-Procedure History & Physical: HPI:  Colleen Dixon is a 67 y.o. female here for setting up a follow-up surveillance colonoscopy.  History of multiple colonic adenomas removed over time.  History of colitis 5 years ago which resolved with antibiotics.  States she is been doing well.  Over Thanksgiving, however, she had a couple episodes of loose stools with some blood.  That resolved within 48 hours.  No upper GI tract symptoms such as nausea, vomiting odynophagia, dysphagia or early satiety.  Only significant health issues since last being seen was a kidney infection for which she was admitted to the hospital for a few days.  Past Medical History:  Diagnosis Date  . Acute colitis 04/2013   hospitalized. treated with cipro/flagyl  . Arthritis    all over in both hips and Rt shoulder  . Asthma   . Chronic kidney disease    left ureteral stone  . Diabetes mellitus without complication ([place])   . Hypertension   . Shoulder pain     Past Surgical History:  Procedure Laterality Date  . ABDOMINAL HYSTERECTOMY     partial  . COLONOSCOPY  02/29/2008   [id number] rectal polyp s/p bx and ablation/diminutive cecal polyps  . COLONOSCOPY N/A 06/28/2013   Procedure: COLONOSCOPY;  Surgeon: Daneil Dolin, MD;  Location: [facility];  Service: Endoscopy;  Laterality: N/A;  8:30  . CYSTOSCOPY WITH STENT PLACEMENT Left 01/30/2015   Procedure: CYSTOSCOPY WITH LEFT DOUBLE J URETERAL STENT PLACEMENT;  Surgeon: Franchot Gallo, MD;  Location: [facility];  Service: Urology;  Laterality: Left;  . fibroids removed    . ROTATOR CUFF REPAIR    . TONSILLECTOMY      Prior to Admission medications   Medication Sig Start Date End Date Taking? Authorizing Provider  acetaminophen (TYLENOL) 650 MG CR tablet Take 650 mg by mouth at bedtime as needed for pain.   Yes [provider]  albuterol (PROVENTIL  HFA;VENTOLIN HFA) 108 (90 Base) MCG/ACT inhaler Inhale into the lungs every 6 (six) hours as needed for wheezing or shortness of breath.   Yes [provider]  amLODipine (NORVASC) 5 MG tablet daily. 05/14/18  Yes [provider]  aspirin 81 MG tablet Take 81 mg by mouth daily.   Yes [provider]  cetirizine (ZYRTEC) 10 MG tablet Take 10 mg by mouth daily.   Yes [provider]  Flaxseed, Linseed, (FLAXSEED OIL PO) Take 1,000 mg by mouth.   Yes [provider]  lisinopril-hydrochlorothiazide (PRINZIDE,ZESTORETIC) 20-25 MG per tablet Take 1 tablet by mouth daily.   Yes [provider]  Multiple Vitamin (MULTIVITAMIN) tablet Take 1 tablet by mouth daily.   Yes [provider]  Vitamins-Lipotropics (LIPO-FLAVONOID PLUS PO) Take by mouth.   Yes [provider]    Allergies as of 07/03/2018 - Review Complete 07/03/2018  Allergen Reaction Noted  . Aspirin Nausea Only 05/04/2013    Family History  Problem Relation Age of Onset  . Cancer Mother     Social History   Socioeconomic History  . Marital status: Married    Spouse name: Not on file  . Number of children: Not on file  . Years of education: Not on file  . Highest education level: Not on file  Occupational History  . Not on file  Social Needs  . Financial resource strain: Not on file  . Food insecurity:  Worry: Not on file    Inability: Not on file  . Transportation needs:    Medical: Not on file    Non-medical: Not on file  Tobacco Use  . Smoking status: Former Research scientist (life sciences)  . Smokeless tobacco: Never Used  Substance and Sexual Activity  . Alcohol use: Yes    Alcohol/week: 1.0 standard drinks    Types: 1 Glasses of wine per week    Comment: socially  . Drug use: No  . Sexual activity: Not on file  Lifestyle  . Physical activity:    Days per week: Not on file    Minutes per session: Not on file  . Stress: Not on file  Relationships  . Social  connections:    Talks on phone: Not on file    Gets together: Not on file    Attends religious service: Not on file    Active member of club or organization: Not on file    Attends meetings of clubs or organizations: Not on file    Relationship status: Not on file  . Intimate partner violence:    Fear of current or ex partner: Not on file    Emotionally abused: Not on file    Physically abused: Not on file    Forced sexual activity: Not on file  Other Topics Concern  . Not on file  Social History Narrative  . Not on file    Review of Systems: See HPI, otherwise negative ROS  Physical Exam: BP (!) 151/86   Pulse 91   Temp 97.7 F (36.5 C) (Oral)   Ht 5\' 2"  (1.575 m)   Wt 192 lb 9.6 oz (87.4 kg)   BMI 35.23 kg/m  General:   Alert,  Well-developed, well-nourished, pleasant and cooperative in NAD Neck:  Supple; no masses or thyromegaly. No significant cervical adenopathy. Lungs:  Clear throughout to auscultation.   No wheezes, crackles, or rhonchi. No acute distress. Heart:  Regular rate and rhythm; no murmurs, clicks, rubs,  or gallops. Abdomen: Non-distended, normal bowel sounds.  Soft and nontender without appreciable mass or hepatosplenomegaly.  Pulses:  Normal pulses noted. Extremities:  Without clubbing or edema.  Impression/Plan: Pleasant 67 year old lady with history of colonic adenoma; due for surveillance colonoscopy at this time.  History of colitis several years ago which resolved with antibiotics.  Recent bout of self-limiting loose stools and rectal bleeding of uncertain significance. Again, she is back to her baseline of what she perceives to be normal bowel function.  Recommendations: I have offered the patient a surveillance colonoscopy in the near future. The risks, benefits, limitations, alternatives and imponderables have been reviewed with the patient. Questions have been answered. All parties are agreeable.  We will utilize conscious sedation.  Further  recommendations to follow.     Notice: This dictation was prepared with Dragon dictation along with smaller phrase technology. Any transcriptional errors that result from this process are unintentional and may not be corrected upon review.

## 2018-07-03 NOTE — Patient Instructions (Signed)
Schedule a surveillance colonoscopy - hx of colonic polyps  Split prep  Conscious sedation  Further recommendations to follow

## 2018-08-07 ENCOUNTER — Encounter (HOSPITAL_COMMUNITY): Payer: Self-pay | Admitting: *Deleted

## 2018-08-07 ENCOUNTER — Encounter (HOSPITAL_COMMUNITY)
Admission: RE | Disposition: A | Discharge status: Home / Self Care | Payer: Self-pay | Source: Home / Self Care | Attending: Internal Medicine

## 2018-08-07 ENCOUNTER — Ambulatory Visit (HOSPITAL_COMMUNITY)
Admission: RE | Admit: 2018-08-07 | Discharge: 2018-08-07 | Disposition: A | Discharge status: Home / Self Care | Payer: Medicare Other | Attending: Internal Medicine | Admitting: Internal Medicine

## 2018-08-07 ENCOUNTER — Other Ambulatory Visit: Payer: Self-pay

## 2018-08-07 DIAGNOSIS — K635 Polyp of colon: Secondary | ICD-10-CM | POA: Insufficient documentation

## 2018-08-07 DIAGNOSIS — I10 Essential (primary) hypertension: Secondary | ICD-10-CM | POA: Diagnosis not present

## 2018-08-07 DIAGNOSIS — Z7982 Long term (current) use of aspirin: Secondary | ICD-10-CM | POA: Insufficient documentation

## 2018-08-07 DIAGNOSIS — Z79899 Other long term (current) drug therapy: Secondary | ICD-10-CM | POA: Diagnosis not present

## 2018-08-07 DIAGNOSIS — Z86010 Personal history of colonic polyps: Secondary | ICD-10-CM | POA: Diagnosis not present

## 2018-08-07 DIAGNOSIS — E119 Type 2 diabetes mellitus without complications: Secondary | ICD-10-CM | POA: Insufficient documentation

## 2018-08-07 DIAGNOSIS — J45909 Unspecified asthma, uncomplicated: Secondary | ICD-10-CM | POA: Diagnosis not present

## 2018-08-07 DIAGNOSIS — D124 Benign neoplasm of descending colon: Secondary | ICD-10-CM

## 2018-08-07 DIAGNOSIS — D120 Benign neoplasm of cecum: Secondary | ICD-10-CM

## 2018-08-07 DIAGNOSIS — K5730 Diverticulosis of large intestine without perforation or abscess without bleeding: Secondary | ICD-10-CM | POA: Insufficient documentation

## 2018-08-07 DIAGNOSIS — Z87891 Personal history of nicotine dependence: Secondary | ICD-10-CM | POA: Insufficient documentation

## 2018-08-07 DIAGNOSIS — Z1211 Encounter for screening for malignant neoplasm of colon: Secondary | ICD-10-CM | POA: Insufficient documentation

## 2018-08-07 HISTORY — PX: COLONOSCOPY: SHX5424

## 2018-08-07 HISTORY — DX: Personal history of urinary calculi: Z87.442

## 2018-08-07 HISTORY — PX: POLYPECTOMY: SHX5525

## 2018-08-07 SURGERY — COLONOSCOPY
Anesthesia: Moderate Sedation

## 2018-08-07 MED ORDER — ONDANSETRON HCL 4 MG/2ML IJ SOLN
INTRAMUSCULAR | Status: AC
  Filled 2018-08-07: qty 2

## 2018-08-07 MED ORDER — STERILE WATER FOR IRRIGATION IR SOLN
Status: DC | PRN
  Administered 2018-08-07: 1.5 mL

## 2018-08-07 MED ORDER — MIDAZOLAM HCL 5 MG/5ML IJ SOLN
INTRAMUSCULAR | Status: AC
  Filled 2018-08-07: qty 10

## 2018-08-07 MED ORDER — MEPERIDINE HCL 50 MG/ML IJ SOLN
INTRAMUSCULAR | Status: AC
  Filled 2018-08-07: qty 1

## 2018-08-07 MED ORDER — SODIUM CHLORIDE 0.9 % IV SOLN
INTRAVENOUS | Status: DC
  Administered 2018-08-07: 1000 mL via INTRAVENOUS

## 2018-08-07 MED ORDER — MIDAZOLAM HCL 5 MG/5ML IJ SOLN
INTRAMUSCULAR | Status: DC | PRN
  Administered 2018-08-07: 2 mg via INTRAVENOUS
  Administered 2018-08-07: 1 mg via INTRAVENOUS

## 2018-08-07 MED ORDER — MEPERIDINE HCL 100 MG/ML IJ SOLN
INTRAMUSCULAR | Status: DC | PRN
  Administered 2018-08-07: 25 mg via INTRAVENOUS
  Administered 2018-08-07: 15 mg via INTRAVENOUS

## 2018-08-07 MED ORDER — ONDANSETRON HCL 4 MG/2ML IJ SOLN
INTRAMUSCULAR | Status: DC | PRN
  Administered 2018-08-07: 4 mg via INTRAVENOUS

## 2018-08-07 NOTE — Discharge Instructions (Signed)
°Colonoscopy °Discharge Instructions ° °Read the instructions outlined below and refer to this sheet in the next few weeks. These discharge instructions provide you with general information on caring for yourself after you leave the hospital. Your doctor may also give you specific instructions. While your treatment has been planned according to the most current medical practices available, unavoidable complications occasionally occur. If you have any problems or questions after discharge, call Dr. Rourk at [phone]. °ACTIVITY °· You may resume your regular activity, but move at a slower pace for the next 24 hours.  °· Take frequent rest periods for the next 24 hours.  °· Walking will help get rid of the air and reduce the bloated feeling in your belly (abdomen).  °· No driving for 24 hours (because of the medicine (anesthesia) used during the test).   °· Do not sign any important legal documents or operate any machinery for 24 hours (because of the anesthesia used during the test).  °NUTRITION °· Drink plenty of fluids.  °· You may resume your normal diet as instructed by your doctor.  °· Begin with a light meal and progress to your normal diet. Heavy or fried foods are harder to digest and may make you feel sick to your stomach (nauseated).  °· Avoid alcoholic beverages for 24 hours or as instructed.  °MEDICATIONS °· You may resume your normal medications unless your doctor tells you otherwise.  °WHAT YOU CAN EXPECT TODAY °· Some feelings of bloating in the abdomen.  °· Passage of more gas than usual.  °· Spotting of blood in your stool or on the toilet paper.  °IF YOU HAD POLYPS REMOVED DURING THE COLONOSCOPY: °· No aspirin products for 7 days or as instructed.  °· No alcohol for 7 days or as instructed.  °· Eat a soft diet for the next 24 hours.  °FINDING OUT THE RESULTS OF YOUR TEST °Not all test results are available during your visit. If your test results are not back during the visit, make an appointment  with your caregiver to find out the results. Do not assume everything is normal if you have not heard from your caregiver or the medical facility. It is important for you to follow up on all of your test results.  °SEEK IMMEDIATE MEDICAL ATTENTION IF: °· You have more than a spotting of blood in your stool.  °· Your belly is swollen (abdominal distention).  °· You are nauseated or vomiting.  °· You have a temperature over 101.  °· You have abdominal pain or discomfort that is severe or gets worse throughout the day.  ° ° °Diverticulosis and colon polyp information provided ° °Further recommendations to follow pending review of pathology report ° ° °Colon Polyps ° °Polyps are tissue growths inside the body. Polyps can grow in many places, including the large intestine (colon). A polyp may be a round bump or a mushroom-shaped growth. You could have one polyp or several. °Most colon polyps are noncancerous (benign). However, some colon polyps can become cancerous over time. Finding and removing the polyps early can help prevent this. °What are the causes? °The exact cause of colon polyps is not known. °What increases the risk? °You are more likely to develop this condition if you: °· Have a family history of colon cancer or colon polyps. °· Are older than 50 or older than 45 if you are African American. °· Have inflammatory bowel disease, such as ulcerative colitis or Crohn's disease. °· Have certain hereditary   conditions, such as: °? Familial adenomatous polyposis. °? Lynch syndrome. °? Turcot syndrome. °? Peutz-Jeghers syndrome. °· Are overweight. °· Smoke cigarettes. °· Do not get enough exercise. °· Drink too much alcohol. °· Eat a diet that is high in fat and red meat and low in fiber. °· Had childhood cancer that was treated with abdominal radiation. °What are the signs or symptoms? °Most polyps do not cause symptoms. °If you have symptoms, they may include: °· Blood coming from your rectum when having a bowel  movement. °· Blood in your stool. The stool may look dark red or black. °· Abdominal pain. °· A change in bowel habits, such as constipation or diarrhea. °How is this diagnosed? °This condition is diagnosed with a colonoscopy. This is a procedure in which a lighted, flexible scope is inserted into the anus and then passed into the colon to examine the area. Polyps are sometimes found when a colonoscopy is done as part of routine cancer screening tests. °How is this treated? °Treatment for this condition involves removing any polyps that are found. Most polyps can be removed during a colonoscopy. Those polyps will then be tested for cancer. Additional treatment may be needed depending on the results of testing. °Follow these instructions at home: °Lifestyle °· Maintain a healthy weight, or lose weight if recommended by your health care provider. °· Exercise every day or as told by your health care provider. °· Do not use any products that contain nicotine or tobacco, such as cigarettes and e-cigarettes. If you need help quitting, ask your health care provider. °· If you drink alcohol, limit how much you have: °? 0-1 drink a day for women. °? 0-2 drinks a day for men. °· Be aware of how much alcohol is in your drink. In the [place] one drink equals one 12 oz bottle of beer (355 mL), one 5 oz glass of wine (148 mL), or one 1½ oz shot of hard liquor (44 mL). °Eating and drinking ° °· Eat foods that are high in fiber, such as fruits, vegetables, and whole grains. °· Eat foods that are high in calcium and vitamin D, such as milk, cheese, yogurt, eggs, liver, fish, and broccoli. °· Limit foods that are high in fat, such as fried foods and desserts. °· Limit the amount of red meat and processed meat you eat, such as hot dogs, sausage, bacon, and lunch meats. °General instructions °· Keep all follow-up visits as told by your health care provider. This is important. °? This includes having regularly scheduled  colonoscopies. °? Talk to your health care provider about when you need a colonoscopy. °Contact a health care provider if: °· You have new or worsening bleeding during a bowel movement. °· You have new or increased blood in your stool. °· You have a change in bowel habits. °· You lose weight for no known reason. °Summary °· Polyps are tissue growths inside the body. Polyps can grow in many places, including the colon. °· Most colon polyps are noncancerous (benign), but some can become cancerous over time. °· This condition is diagnosed with a colonoscopy. °· Treatment for this condition involves removing any polyps that are found. Most polyps can be removed during a colonoscopy. °This information is not intended to replace advice given to you by your health care provider. Make sure you discuss any questions you have with your health care provider. °Document Released: 04/10/2004 Document Revised: 10/30/2017 Document Reviewed: 10/30/2017 °Elsevier Interactive Patient Education © 2019 [employer] ° °  Diverticulosis ° °Diverticulosis is a condition that develops when small pouches (diverticula) form in the wall of the large intestine (colon). The colon is where water is absorbed and stool is formed. The pouches form when the inside layer of the colon pushes through weak spots in the outer layers of the colon. You may have a few pouches or many of them. °What are the causes? °The cause of this condition is not known. °What increases the risk? °The following factors may make you more likely to develop this condition: °· Being older than age 60. Your risk for this condition increases with age. Diverticulosis is rare among people younger than age 30. By age 80, many people have it. °· Eating a low-fiber diet. °· Having frequent constipation. °· Being overweight. °· Not getting enough exercise. °· Smoking. °· Taking over-the-counter pain medicines, like aspirin and ibuprofen. °· Having a family history of  diverticulosis. °What are the signs or symptoms? °In most people, there are no symptoms of this condition. If you do have symptoms, they may include: °· Bloating. °· Cramps in the abdomen. °· Constipation or diarrhea. °· Pain in the lower left side of the abdomen. °How is this diagnosed? °This condition is most often diagnosed during an exam for other colon problems. Because diverticulosis usually has no symptoms, it often cannot be diagnosed independently. This condition may be diagnosed by: °· Using a flexible scope to examine the colon (colonoscopy). °· Taking an X-ray of the colon after dye has been put into the colon (barium enema). °· Doing a CT scan. °How is this treated? °You may not need treatment for this condition if you have never developed an infection related to diverticulosis. If you have had an infection before, treatment may include: °· Eating a high-fiber diet. This may include eating more fruits, vegetables, and grains. °· Taking a fiber supplement. °· Taking a live bacteria supplement (probiotic). °· Taking medicine to relax your colon. °· Taking antibiotic medicines. °Follow these instructions at home: °· Drink 6-8 glasses of water or more each day to prevent constipation. °· Try not to strain when you have a bowel movement. °· If you have had an infection before: °? Eat more fiber as directed by your health care provider or your diet and nutrition specialist (dietitian). °? Take a fiber supplement or probiotic, if your health care provider approves. °· Take over-the-counter and prescription medicines only as told by your health care provider. °· If you were prescribed an antibiotic, take it as told by your health care provider. Do not stop taking the antibiotic even if you start to feel better. °· Keep all follow-up visits as told by your health care provider. This is important. °Contact a health care provider if: °· You have pain in your abdomen. °· You have bloating. °· You have  cramps. °· You have not had a bowel movement in 3 days. °Get help right away if: °· Your pain gets worse. °· Your bloating becomes very bad. °· You have a fever or chills, and your symptoms suddenly get worse. °· You vomit. °· You have bowel movements that are bloody or black. °· You have bleeding from your rectum. °Summary °· Diverticulosis is a condition that develops when small pouches (diverticula) form in the wall of the large intestine (colon). °· You may have a few pouches or many of them. °· This condition is most often diagnosed during an exam for other colon problems. °· If you have had an infection related   to diverticulosis, treatment may include increasing the fiber in your diet, taking supplements, or taking medicines. °This information is not intended to replace advice given to you by your health care provider. Make sure you discuss any questions you have with your health care provider. °Document Released: 04/11/2004 Document Revised: 06/03/2016 Document Reviewed: 06/03/2016 °Elsevier Interactive Patient Education © 2019 Elsevier Inc. ° °

## 2018-08-07 NOTE — H&P (Signed)
_0 @   Primary Care Physician:  Christoper Fabian, MD Primary Gastroenterologist:  Dr. Gala Romney  Pre-Procedure History & Physical: HPI:  Colleen Dixon is a 68 y.o. female here for surveillance colonoscopy.  History of multiple colonic adenomas removed over time.  Past Medical History:  Diagnosis Date  . Acute colitis 04/2013   hospitalized. treated with cipro/flagyl  . Arthritis    all over in both hips and Rt shoulder  . Asthma   . Chronic kidney disease    left ureteral stone  . Diabetes mellitus without complication ([place])   . History of kidney stones   . Hypertension   . Shoulder pain     Past Surgical History:  Procedure Laterality Date  . ABDOMINAL HYSTERECTOMY     partial  . COLONOSCOPY  02/29/2008   CBU:LAGTXMIWOE rectal polyp s/p bx and ablation/diminutive cecal polyps  . COLONOSCOPY N/A 06/28/2013   Procedure: COLONOSCOPY;  Surgeon: Daneil Dolin, MD;  Location: [facility];  Service: Endoscopy;  Laterality: N/A;  8:30  . CYSTOSCOPY WITH STENT PLACEMENT Left 01/30/2015   Procedure: CYSTOSCOPY WITH LEFT DOUBLE J URETERAL STENT PLACEMENT;  Surgeon: Franchot Gallo, MD;  Location: [facility];  Service: Urology;  Laterality: Left;  . fibroids removed    . ROTATOR CUFF REPAIR    . TONSILLECTOMY      Prior to Admission medications   Medication Sig Start Date End Date Taking? Authorizing Provider  acetaminophen (TYLENOL) 650 MG CR tablet Take 650 mg by mouth every 8 (eight) hours as needed for pain.    Yes [provider]  albuterol (PROVENTIL HFA;VENTOLIN HFA) 108 (90 Base) MCG/ACT inhaler Inhale 1-2 puffs into the lungs every 6 (six) hours as needed for wheezing or shortness of breath.    Yes [provider]  amLODipine (NORVASC) 5 MG tablet Take 5 mg by mouth every evening.  05/14/18  Yes [provider]  aspirin EC 81 MG tablet Take 81 mg by mouth 3 (three) times a week.   Yes [provider]  cetirizine (ZYRTEC) 10 MG tablet Take 10  mg by mouth at bedtime as needed (allergies).    Yes [provider]  Flaxseed, Linseed, (FLAXSEED OIL) 1000 MG CAPS Take 1,000 mg by mouth daily.   Yes [provider]  lisinopril-hydrochlorothiazide (PRINZIDE,ZESTORETIC) 20-25 MG per tablet Take 1 tablet by mouth daily.   Yes [provider]  Multiple Vitamin (MULTIVITAMIN WITH MINERALS) TABS tablet Take 1 tablet by mouth daily. Alive Women's Multivitamin 50+   Yes [provider]  Na Sulfate-K Sulfate-Mg Sulf 17.5-3.13-1.6 GM/177ML SOLN Take 1 kit by mouth as directed. 07/03/18  Yes Anchor Dwan, Cristopher Estimable, MD  naphazoline-pheniramine (ALLERGY EYE) 0.025-0.3 % ophthalmic solution Place 1 drop into both eyes 4 (four) times daily as needed for allergies.   Yes [provider]  Vitamins-Lipotropics (LIPO-FLAVONOID PLUS PO) Take 1 tablet by mouth daily as needed (ear ringing).    Yes [provider]    Allergies as of 07/03/2018 - Review Complete 07/03/2018  Allergen Reaction Noted  . Aspirin Nausea Only 05/04/2013    Family History  Problem Relation Age of Onset  . Pancreatic cancer Mother   . Dementia Father   . Colon cancer Maternal Aunt     Social History   Socioeconomic History  . Marital status: Married    Spouse name: Not on file  . Number of children: Not on file  . Years of education: Not on file  .  Highest education level: Not on file  Occupational History  . Not on file  Social Needs  . Financial resource strain: Not on file  . Food insecurity:    Worry: Not on file    Inability: Not on file  . Transportation needs:    Medical: Not on file    Non-medical: Not on file  Tobacco Use  . Smoking status: Former Research scientist (life sciences)  . Smokeless tobacco: Never Used  Substance and Sexual Activity  . Alcohol use: Yes    Alcohol/week: 1.0 standard drinks    Types: 1 Glasses of wine per week    Comment: socially  . Drug use: No  . Sexual activity: Not on file  Lifestyle  . Physical  activity:    Days per week: Not on file    Minutes per session: Not on file  . Stress: Not on file  Relationships  . Social connections:    Talks on phone: Not on file    Gets together: Not on file    Attends religious service: Not on file    Active member of club or organization: Not on file    Attends meetings of clubs or organizations: Not on file    Relationship status: Not on file  . Intimate partner violence:    Fear of current or ex partner: Not on file    Emotionally abused: Not on file    Physically abused: Not on file    Forced sexual activity: Not on file  Other Topics Concern  . Not on file  Social History Narrative  . Not on file    Review of Systems: See HPI, otherwise negative ROS  Physical Exam: BP (!) 155/72   Pulse 78   Temp 98.8 F (37.1 C) (Oral)   Resp 18   Ht _0  (1.6 m)   Wt 87.5 kg   SpO2 99%   BMI 34.19 kg/m  General:   Alert,  Well-developed, well-nourished, pleasant and cooperative in NAD Neck:  Supple; no masses or thyromegaly. No significant cervical adenopathy. Lungs:  Clear throughout to auscultation.   No wheezes, crackles, or rhonchi. No acute distress. Heart:  Regular rate and rhythm; no murmurs, clicks, rubs,  or gallops. Abdomen: Non-distended, normal bowel sounds.  Soft and nontender without appreciable mass or hepatosplenomegaly.  Pulses:  Normal pulses noted. Extremities:  Without clubbing or edema.  Impression/Plan: Pleasant 68 year old lady with a history of colonic adenomas.  Here for surveillance colonoscopy per plan.  The risks, benefits, limitations, alternatives and imponderables have been reviewed with the patient. Questions have been answered. All parties are agreeable.      Notice: This dictation was prepared with Dragon dictation along with smaller phrase technology. Any transcriptional errors that result from this process are unintentional and may not be corrected upon review.

## 2018-08-07 NOTE — Op Note (Signed)
[facility] Patient Name: Colleen Dixon Procedure Date: 08/07/2018 1:40 PM MRN: [id number] Date of Birth: 1951-07-24 Attending MD: Norvel Richards , MD CSN: [id number] Age: 68 Admit Type: Outpatient Procedure:                Colonoscopy Indications:              High risk colon cancer surveillance: Personal                            history of colonic polyps Providers:                Norvel Richards, MD, Charlsie Quest. Theda Sers RN, RN,                            Nelma Rothman, Technician Referring MD:              Medicines:                Midazolam 3 mg IV, Meperidine 40 mg IV Complications:            No immediate complications. Estimated Blood Loss:     Estimated blood loss was minimal. Procedure:                After obtaining informed consent, the colonoscope                            was passed under direct vision. Throughout the                            procedure, the patient's blood pressure, pulse, and                            oxygen saturations were monitored continuously. The                            [id number]-[id number] ([id number]) scope was introduced through                            the anus and advanced to the the cecum, identified                            by appendiceal orifice and ileocecal valve. Scope In: 2:01:45 PM Scope Out: 2:22:10 PM Scope Withdrawal Time: 0 hours 14 minutes 11 seconds  Total Procedure Duration: 0 hours 20 minutes 25 seconds  Findings:      The perianal and digital rectal examinations were normal.      Scattered medium-mouthed diverticula were found in the sigmoid colon and       descending colon.      Two semi-pedunculated polyps were found in the cecum. The polyps were 4       to 7 mm in size. These polyps were removed with a cold snare. Resection       and retrieval were complete. Estimated blood loss was minimal.      A 4 mm polyp was found in the descending colon. The polyp was sessile.       The polyp was removed with a cold biopsy  forceps. Resection and  retrieval were complete. Estimated blood loss was minimal.      The exam was otherwise without abnormality on direct and retroflexion       views. Impression:               - Diverticulosis in the sigmoid colon and in the                            descending colon.                           - Two 4 to 7 mm polyps in the cecum, removed with a                            cold snare. Resected and retrieved.                           - One 4 mm polyp in the descending colon, removed                            with a cold biopsy forceps. Resected and retrieved.                           - The examination was otherwise normal on direct                            and retroflexion views. Moderate Sedation:      Moderate (conscious) sedation was administered by the endoscopy nurse       and supervised by the endoscopist. The following parameters were       monitored: oxygen saturation, heart rate, blood pressure, respiratory       rate, EKG, adequacy of pulmonary ventilation, and response to care.       Total physician intraservice time was 26 minutes. Recommendation:           - Patient has a contact number available for                            emergencies. The signs and symptoms of potential                            delayed complications were discussed with the                            patient. Return to normal activities tomorrow.                            Written discharge instructions were provided to the                            patient.                           - Resume previous diet.                           -  Continue present medications.                           - Repeat colonoscopy date to be determined after                            pending pathology results are reviewed for                            surveillance based on pathology results.                           - Return to GI office (date not yet determined). Procedure Code(s):         --- Professional ---                           [phone], Colonoscopy, flexible; with removal of                            tumor(s), polyp(s), or other lesion(s) by snare                            technique                           45380, 59, Colonoscopy, flexible; with biopsy,                            single or multiple                           99153, Moderate sedation; each additional 15                            minutes intraservice time                           G0500, Moderate sedation services provided by the                            same physician or other qualified health care                            professional performing a gastrointestinal                            endoscopic service that sedation supports,                            requiring the presence of an independent trained                            observer to assist in the monitoring of the                            patient's level of consciousness and physiological  status; initial 15 minutes of intra-service time;                            patient age 46 years or older (additional time may                            be reported with [phone], as appropriate) Diagnosis Code(s):        --- Professional ---                           Z86.010, Personal history of colonic polyps                           D12.0, Benign neoplasm of cecum                           D12.4, Benign neoplasm of descending colon                           K57.30, Diverticulosis of large intestine without                            perforation or abscess without bleeding CPT copyright 2018 American [employer]. All rights reserved. The codes documented in this report are preliminary and upon coder review may  be revised to meet current compliance requirements. Cristopher Estimable. Rourk, MD Norvel Richards, MD 08/07/2018 2:30:15 PM This report has been signed electronically. Number of Addenda: 0

## 2018-08-11 ENCOUNTER — Encounter (HOSPITAL_COMMUNITY): Payer: Self-pay | Admitting: Internal Medicine

## 2018-08-11 ENCOUNTER — Encounter: Payer: Self-pay | Admitting: Internal Medicine

## 2018-10-13 ENCOUNTER — Other Ambulatory Visit (HOSPITAL_COMMUNITY): Payer: Self-pay | Admitting: Family Medicine

## 2018-10-13 DIAGNOSIS — N6002 Solitary cyst of left breast: Secondary | ICD-10-CM

## 2018-10-27 ENCOUNTER — Ambulatory Visit (HOSPITAL_COMMUNITY)
Admission: RE | Admit: 2018-10-27 | Discharge: 2018-10-27 | Disposition: A | Discharge status: Home / Self Care | Payer: Medicare Other | Source: Ambulatory Visit | Attending: Family Medicine | Admitting: Family Medicine

## 2018-10-27 ENCOUNTER — Other Ambulatory Visit: Payer: Self-pay

## 2018-10-27 DIAGNOSIS — N6002 Solitary cyst of left breast: Secondary | ICD-10-CM

## 2019-02-02 ENCOUNTER — Other Ambulatory Visit (HOSPITAL_COMMUNITY): Payer: Self-pay | Admitting: Family Medicine

## 2019-02-02 DIAGNOSIS — Z1231 Encounter for screening mammogram for malignant neoplasm of breast: Secondary | ICD-10-CM

## 2019-02-10 ENCOUNTER — Encounter (HOSPITAL_COMMUNITY): Payer: Self-pay

## 2019-02-10 ENCOUNTER — Ambulatory Visit (HOSPITAL_COMMUNITY)
Admission: RE | Admit: 2019-02-10 | Discharge: 2019-02-10 | Disposition: A | Discharge status: Home / Self Care | Payer: Medicare Other | Source: Ambulatory Visit | Attending: Family Medicine | Admitting: Family Medicine

## 2019-02-10 ENCOUNTER — Other Ambulatory Visit: Payer: Self-pay

## 2019-02-10 DIAGNOSIS — Z1231 Encounter for screening mammogram for malignant neoplasm of breast: Secondary | ICD-10-CM

## 2020-01-13 ENCOUNTER — Other Ambulatory Visit (HOSPITAL_COMMUNITY): Payer: Self-pay | Admitting: Family Medicine

## 2020-01-13 DIAGNOSIS — Z1231 Encounter for screening mammogram for malignant neoplasm of breast: Secondary | ICD-10-CM

## 2020-02-14 ENCOUNTER — Other Ambulatory Visit: Payer: Self-pay

## 2020-02-14 ENCOUNTER — Ambulatory Visit (HOSPITAL_COMMUNITY)
Admission: RE | Admit: 2020-02-14 | Discharge: 2020-02-14 | Disposition: A | Discharge status: Home / Self Care | Payer: Medicare Other | Source: Ambulatory Visit | Attending: Family Medicine | Admitting: Family Medicine

## 2020-02-14 DIAGNOSIS — Z1231 Encounter for screening mammogram for malignant neoplasm of breast: Secondary | ICD-10-CM | POA: Insufficient documentation

## 2021-01-24 ENCOUNTER — Other Ambulatory Visit (HOSPITAL_COMMUNITY): Payer: Self-pay | Admitting: Family Medicine

## 2021-01-24 DIAGNOSIS — Z1231 Encounter for screening mammogram for malignant neoplasm of breast: Secondary | ICD-10-CM

## 2021-02-15 ENCOUNTER — Ambulatory Visit (HOSPITAL_COMMUNITY)
Admission: RE | Admit: 2021-02-15 | Discharge: 2021-02-15 | Disposition: A | Discharge status: Home / Self Care | Payer: Medicare Other | Source: Ambulatory Visit | Attending: Family Medicine | Admitting: Family Medicine

## 2021-02-15 ENCOUNTER — Other Ambulatory Visit: Payer: Self-pay

## 2021-02-15 DIAGNOSIS — Z1231 Encounter for screening mammogram for malignant neoplasm of breast: Secondary | ICD-10-CM | POA: Diagnosis present

## 2021-09-24 ENCOUNTER — Other Ambulatory Visit: Payer: Self-pay

## 2021-09-24 ENCOUNTER — Other Ambulatory Visit (HOSPITAL_COMMUNITY): Payer: Self-pay | Admitting: Family Medicine

## 2021-09-24 ENCOUNTER — Other Ambulatory Visit: Payer: Self-pay | Admitting: Family Medicine

## 2021-09-24 ENCOUNTER — Ambulatory Visit (HOSPITAL_COMMUNITY)
Admission: RE | Admit: 2021-09-24 | Discharge: 2021-09-24 | Disposition: A | Discharge status: Home / Self Care | Payer: Medicare Other | Source: Ambulatory Visit | Attending: Family Medicine | Admitting: Family Medicine

## 2021-09-24 DIAGNOSIS — J4540 Moderate persistent asthma, uncomplicated: Secondary | ICD-10-CM

## 2021-09-24 DIAGNOSIS — R102 Pelvic and perineal pain: Secondary | ICD-10-CM

## 2021-10-08 ENCOUNTER — Other Ambulatory Visit: Payer: Self-pay

## 2021-10-08 ENCOUNTER — Ambulatory Visit (HOSPITAL_COMMUNITY)
Admission: RE | Admit: 2021-10-08 | Discharge: 2021-10-08 | Disposition: A | Discharge status: Home / Self Care | Payer: Medicare Other | Source: Ambulatory Visit | Attending: Family Medicine | Admitting: Family Medicine

## 2021-10-08 DIAGNOSIS — R102 Pelvic and perineal pain: Secondary | ICD-10-CM | POA: Insufficient documentation

## 2022-01-16 ENCOUNTER — Other Ambulatory Visit (HOSPITAL_COMMUNITY): Payer: Self-pay | Admitting: Family Medicine

## 2022-01-16 DIAGNOSIS — Z1231 Encounter for screening mammogram for malignant neoplasm of breast: Secondary | ICD-10-CM

## 2022-02-18 ENCOUNTER — Ambulatory Visit (HOSPITAL_COMMUNITY)
Admission: RE | Admit: 2022-02-18 | Discharge: 2022-02-18 | Disposition: A | Discharge status: Home / Self Care | Payer: Medicare Other | Source: Ambulatory Visit | Attending: Family Medicine | Admitting: Family Medicine

## 2022-02-18 DIAGNOSIS — Z1231 Encounter for screening mammogram for malignant neoplasm of breast: Secondary | ICD-10-CM | POA: Insufficient documentation

## 2023-01-13 ENCOUNTER — Other Ambulatory Visit (HOSPITAL_COMMUNITY): Payer: Self-pay | Admitting: Family Medicine

## 2023-01-13 ENCOUNTER — Encounter (HOSPITAL_COMMUNITY): Payer: Self-pay | Admitting: Family Medicine

## 2023-01-13 DIAGNOSIS — Z1231 Encounter for screening mammogram for malignant neoplasm of breast: Secondary | ICD-10-CM

## 2023-02-21 ENCOUNTER — Ambulatory Visit (HOSPITAL_COMMUNITY)
Admission: RE | Admit: 2023-02-21 | Discharge: 2023-02-21 | Disposition: A | Discharge status: Home / Self Care | Payer: Medicare Other | Source: Ambulatory Visit | Attending: Family Medicine | Admitting: Family Medicine

## 2023-02-21 DIAGNOSIS — Z1231 Encounter for screening mammogram for malignant neoplasm of breast: Secondary | ICD-10-CM | POA: Diagnosis not present

## 2023-04-15 ENCOUNTER — Other Ambulatory Visit: Payer: Self-pay

## 2023-04-15 ENCOUNTER — Emergency Department (HOSPITAL_COMMUNITY)
Admission: EM | Admit: 2023-04-15 | Discharge: 2023-04-15 | Disposition: A | Discharge status: Home / Self Care | Payer: Medicare Other | Attending: Student | Admitting: Student

## 2023-04-15 ENCOUNTER — Encounter (HOSPITAL_COMMUNITY): Payer: Self-pay | Admitting: *Deleted

## 2023-04-15 DIAGNOSIS — Z79899 Other long term (current) drug therapy: Secondary | ICD-10-CM | POA: Diagnosis not present

## 2023-04-15 DIAGNOSIS — Z7982 Long term (current) use of aspirin: Secondary | ICD-10-CM | POA: Insufficient documentation

## 2023-04-15 DIAGNOSIS — L0390 Cellulitis, unspecified: Secondary | ICD-10-CM

## 2023-04-15 DIAGNOSIS — I129 Hypertensive chronic kidney disease with stage 1 through stage 4 chronic kidney disease, or unspecified chronic kidney disease: Secondary | ICD-10-CM | POA: Diagnosis not present

## 2023-04-15 DIAGNOSIS — E1122 Type 2 diabetes mellitus with diabetic chronic kidney disease: Secondary | ICD-10-CM | POA: Insufficient documentation

## 2023-04-15 DIAGNOSIS — Z87891 Personal history of nicotine dependence: Secondary | ICD-10-CM | POA: Diagnosis not present

## 2023-04-15 DIAGNOSIS — Z87442 Personal history of urinary calculi: Secondary | ICD-10-CM | POA: Insufficient documentation

## 2023-04-15 DIAGNOSIS — L03114 Cellulitis of left upper limb: Secondary | ICD-10-CM | POA: Insufficient documentation

## 2023-04-15 DIAGNOSIS — J45909 Unspecified asthma, uncomplicated: Secondary | ICD-10-CM | POA: Diagnosis not present

## 2023-04-15 DIAGNOSIS — N189 Chronic kidney disease, unspecified: Secondary | ICD-10-CM | POA: Diagnosis not present

## 2023-04-15 DIAGNOSIS — R21 Rash and other nonspecific skin eruption: Secondary | ICD-10-CM | POA: Diagnosis not present

## 2023-04-15 DIAGNOSIS — M79602 Pain in left arm: Secondary | ICD-10-CM | POA: Diagnosis present

## 2023-04-15 LAB — CBC WITH DIFFERENTIAL/PLATELET
Abs Immature Granulocytes: 0.03 10*3/uL (ref 0.00–0.07)
Basophils Absolute: 0 10*3/uL (ref 0.0–0.1)
Basophils Relative: 0 %
Eosinophils Absolute: 0.5 10*3/uL (ref 0.0–0.5)
Eosinophils Relative: 5 %
HCT: 44.9 % (ref 36.0–46.0)
Hemoglobin: 14.3 g/dL (ref 12.0–15.0)
Immature Granulocytes: 0 %
Lymphocytes Relative: 17 %
Lymphs Abs: 1.7 10*3/uL (ref 0.7–4.0)
MCH: 29 pg (ref 26.0–34.0)
MCHC: 31.8 g/dL (ref 30.0–36.0)
MCV: 91.1 fL (ref 80.0–100.0)
Monocytes Absolute: 0.8 10*3/uL (ref 0.1–1.0)
Monocytes Relative: 8 %
Neutro Abs: 7.1 10*3/uL (ref 1.7–7.7)
Neutrophils Relative %: 70 %
Platelets: 221 10*3/uL (ref 150–400)
RBC: 4.93 MIL/uL (ref 3.87–5.11)
RDW: 14 % (ref 11.5–15.5)
WBC: 10.1 10*3/uL (ref 4.0–10.5)
nRBC: 0 % (ref 0.0–0.2)

## 2023-04-15 LAB — COMPREHENSIVE METABOLIC PANEL
ALT: 43 U/L (ref 0–44)
AST: 39 U/L (ref 15–41)
Albumin: 3.7 g/dL (ref 3.5–5.0)
Alkaline Phosphatase: 66 U/L (ref 38–126)
Anion gap: 10 (ref 5–15)
BUN: 18 mg/dL (ref 8–23)
CO2: 26 mmol/L (ref 22–32)
Calcium: 9.4 mg/dL (ref 8.9–10.3)
Chloride: 104 mmol/L (ref 98–111)
Creatinine, Ser: 0.82 mg/dL (ref 0.44–1.00)
GFR, Estimated: >60 mL/min (ref >60)
Glucose, Bld: 97 mg/dL (ref 70–99)
Potassium: 3.7 mmol/L (ref 3.5–5.1)
Sodium: 140 mmol/L (ref 135–145)
Total Bilirubin: 0.5 mg/dL (ref 0.3–1.2)
Total Protein: 7.8 g/dL (ref 6.5–8.1)

## 2023-04-15 MED ORDER — CEPHALEXIN 500 MG PO CAPS
500.0000 mg | ORAL_CAPSULE | Freq: Once | ORAL | Status: AC
  Administered 2023-04-15: 500 mg via ORAL
  Filled 2023-04-15: qty 1

## 2023-04-15 MED ORDER — DIPHENHYDRAMINE HCL 25 MG PO CAPS
25.0000 mg | ORAL_CAPSULE | Freq: Once | ORAL | Status: AC
  Administered 2023-04-15: 25 mg via ORAL
  Filled 2023-04-15: qty 1

## 2023-04-15 MED ORDER — CEFADROXIL 500 MG PO CAPS: 500.0000 mg | ORAL_CAPSULE | Freq: Two times a day (BID) | ORAL | 0 refills | Status: AC

## 2023-04-15 MED ORDER — DEXAMETHASONE SODIUM PHOSPHATE 10 MG/ML IJ SOLN
8.0000 mg | Freq: Once | INTRAMUSCULAR | Status: AC
  Administered 2023-04-15: 8 mg via INTRAMUSCULAR
  Filled 2023-04-15: qty 1

## 2023-04-15 NOTE — ED Triage Notes (Signed)
Pt received PNA vaccine Friday.  Pt with redness and pain, itching to left upper arm.  Pt with leg weakness, pt states it was hard to get out of bed.  Pt with mild SOB( not new), hx of asthma, no distress noted in triage.

## 2023-04-15 NOTE — ED Provider Notes (Signed)
[facility] AT [facility] Provider Note  CSN: [id number] Arrival date & time: 04/15/23 1302  Chief Complaint(s) Allergic Reaction  HPI Colleen Dixon is a 72 y.o. female with PMH nephrolithiasis, HTN who presents emergency room for evaluation of left arm pain and erythema.  Patient received her pneumonia vaccine on Friday, 04/11/2023 at her PCPs office.  The following day she developed a bright red raised lesion that was itchy and painful around that site with associated myalgias, fatigue and shortness of breath.  The symptoms have all improved over the weekend but the erythema is persistent.  She walked in to her PCPs office that she was unable to get appointment and they requested that she go to the emergency department for further evaluation.  Currently denies shortness of breath, abdominal pain, nausea, vomiting, headache, fever, muffled voice or oropharyngeal swelling.   Past Medical History Past Medical History:  Diagnosis Date   Acute colitis 04/2013   hospitalized. treated with cipro/flagyl   Arthritis    all over in both hips and Rt shoulder   Asthma    Chronic kidney disease    left ureteral stone   Diabetes mellitus without complication (HCC)    History of kidney stones    Hypertension    Shoulder pain    Patient Active Problem List   Diagnosis Date Noted   Sepsis (HCC) 02/02/2015   Hydronephrosis 01/30/2015   UTI (urinary tract infection) 01/30/2015   Essential hypertension 01/30/2015   Personal history of colonic polyps 06/11/2013   Bloody stools 05/05/2013   Hypokalemia 05/05/2013   Acute colitis 05/04/2013   Diabetes mellitus type 2 in obese 05/04/2013   Home Medication(s) Prior to Admission medications   Medication Sig Start Date End Date Taking? Authorizing Provider  acetaminophen (TYLENOL) 650 MG CR tablet Take 650 mg by mouth every 8 (eight) hours as needed for pain.     [provider]  albuterol (PROVENTIL  HFA;VENTOLIN HFA) 108 (90 Base) MCG/ACT inhaler Inhale 1-2 puffs into the lungs every 6 (six) hours as needed for wheezing or shortness of breath.     [provider]  amLODipine (NORVASC) 5 MG tablet Take 5 mg by mouth every evening.  05/14/18   [provider]  aspirin EC 81 MG tablet Take 81 mg by mouth 3 (three) times a week.    [provider]  cetirizine (ZYRTEC) 10 MG tablet Take 10 mg by mouth at bedtime as needed (allergies).     [provider]  Flaxseed, Linseed, (FLAXSEED OIL) 1000 MG CAPS Take 1,000 mg by mouth daily.    [provider]  lisinopril-hydrochlorothiazide (PRINZIDE,ZESTORETIC) 20-25 MG per tablet Take 1 tablet by mouth daily.    [provider]  Multiple Vitamin (MULTIVITAMIN WITH MINERALS) TABS tablet Take 1 tablet by mouth daily. Alive Women's Multivitamin 50+    [provider]  Na Sulfate-K Sulfate-Mg Sulf 17.5-3.13-1.6 GM/177ML SOLN Take 1 kit by mouth as directed. 07/03/18   Rourk, Gerrit Friends, MD  naphazoline-pheniramine (ALLERGY EYE) 0.025-0.3 % ophthalmic solution Place 1 drop into both eyes 4 (four) times daily as needed for allergies.    [provider]  Vitamins-Lipotropics (LIPO-FLAVONOID PLUS PO) Take 1 tablet by mouth daily as needed (ear ringing).     [provider]  Past Surgical History Past Surgical History:  Procedure Laterality Date   ABDOMINAL HYSTERECTOMY     partial   COLONOSCOPY  02/29/2008   [id number] rectal polyp s/p bx and ablation/diminutive cecal polyps   COLONOSCOPY N/A 06/28/2013   Procedure: COLONOSCOPY;  Surgeon: Corbin Ade, MD;  [facility]: [facility];  Service: Endoscopy;  Laterality: N/A;  8:30   COLONOSCOPY N/A 08/07/2018   Procedure: COLONOSCOPY;  Surgeon: Corbin Ade, MD;  Location: [facility];  Service:  Endoscopy;  Laterality: N/A;  2:15pm   CYSTOSCOPY WITH STENT PLACEMENT Left 01/30/2015   Procedure: CYSTOSCOPY WITH LEFT DOUBLE J URETERAL STENT PLACEMENT;  Surgeon: Marcine Matar, MD;  Location: [facility];  Service: Urology;  Laterality: Left;   fibroids removed     POLYPECTOMY  08/07/2018   Procedure: POLYPECTOMY;  Surgeon: Corbin Ade, MD;  Location: [facility];  Service: Endoscopy;;  cecal(CSx2), descending colon(CSx1)   ROTATOR CUFF REPAIR     TONSILLECTOMY     Family History Family History  Problem Relation Age of Onset   Pancreatic cancer Mother    Dementia Father    Colon cancer Maternal Aunt     Social History Social History   Tobacco Use   Smoking status: Former   Smokeless tobacco: Never  Vaping Use   Vaping status: Never Used  Substance Use Topics   Alcohol use: Yes    Alcohol/week: 1.0 standard drink of alcohol    Types: 1 Glasses of wine per week    Comment: socially   Drug use: No   Allergies Aspirin  Review of Systems Review of Systems  Skin:  Positive for rash.    Physical Exam Vital Signs  I have reviewed the triage vital signs BP (!) 156/86 (BP Location: Right Arm)   Pulse 90   Temp 99.1 F (37.3 C) (Oral)   Resp 18   Ht 5\' 2"  (1.575 m)   Wt 93.4 kg   SpO2 98%   BMI 37.68 kg/m   Physical Exam Vitals and nursing note reviewed.  Constitutional:      General: She is not in acute distress.    Appearance: She is well-developed.  HENT:     Head: Normocephalic and atraumatic.  Eyes:     Conjunctiva/sclera: Conjunctivae normal.  Cardiovascular:     Rate and Rhythm: Normal rate and regular rhythm.     Heart sounds: No murmur heard. Pulmonary:     Effort: Pulmonary effort is normal. No respiratory distress.     Breath sounds: Normal breath sounds.  Abdominal:     Palpations: Abdomen is soft.     Tenderness: There is no abdominal tenderness.  Musculoskeletal:        General: No swelling.     Cervical back: Neck supple.  Skin:     General: Skin is warm and dry.     Capillary Refill: Capillary refill takes less than 2 seconds.     Findings: Rash present.  Neurological:     Mental Status: She is alert.  Psychiatric:        Mood and Affect: Mood normal.     ED Results and Treatments Labs (all labs ordered are listed, but only abnormal results are displayed) Labs Reviewed  COMPREHENSIVE METABOLIC PANEL  CBC WITH DIFFERENTIAL/PLATELET  Radiology No results found.  Pertinent labs & imaging results that were available during my care of the patient were reviewed by me and considered in my medical decision making (see MDM for details).  Medications Ordered in ED Medications  dexamethasone (DECADRON) injection 8 mg (8 mg Intramuscular Given 04/15/23 1528)  diphenhydrAMINE (BENADRYL) capsule 25 mg (25 mg Oral Given 04/15/23 1527)                                                                                                                                     Procedures Procedures  (including critical care time)  Medical Decision Making / ED Course   This patient presents to the ED for concern of rash, this involves an extensive number of treatment options, and is a complaint that carries with it a high risk of complications and morbidity.  The differential diagnosis includes cellulitis, erysipelas, urticaria, contact dermatitis, thrombophlebitis  MDM: Patient seen emergency room for evaluation of a rash.  Physical exam with a 10 cm area of raised erythema over the deltoid and bicep tracking distally from the site of her vaccination injection site.  Tender to palpation.  No crepitus on evaluation.  Laboratory evaluation largely unremarkable with no leukocytosis.  Initially did trial steroids and Benadryl but did not see significant improvement of this rash.  Suspect cellulitis from injection site  and patient received first dose of antibiotics here in the Emergency Department.  We will cover with Duricef and I instructed the patient to take Claritin twice daily in case there is a histamine component to this reaction.  At this time she does not meet inpatient criteria for admission but she was given return precautions of which she voiced understanding.  Patient then discharged with outpatient PCP follow-up and antibiotics.    Additional history obtained:  -External records from outside source obtained and reviewed including: Chart review including previous notes, labs, imaging, consultation notes   Lab Tests: -I ordered, reviewed, and interpreted labs.   The pertinent results include:   Labs Reviewed  COMPREHENSIVE METABOLIC PANEL  CBC WITH DIFFERENTIAL/PLATELET      Imaging Studies ordered: I ordered imaging studies including none I independently visualized and interpreted imaging. I agree with the radiologist interpretation   Medicines ordered and prescription drug management: Meds ordered this encounter  Medications   dexamethasone (DECADRON) injection 8 mg   diphenhydrAMINE (BENADRYL) capsule 25 mg    -I have reviewed the patients home medicines and have made adjustments as needed  Critical interventions none  Cardiac Monitoring: The patient was maintained on a cardiac monitor.  I personally viewed and interpreted the cardiac monitored which showed an underlying rhythm of: NSR  Social Determinants of Health:  Factors impacting patients care include: none   Reevaluation: After the interventions noted above, I reevaluated the patient and found that they have :stayed the same  Co morbidities that complicate the patient evaluation  Past  Medical History:  Diagnosis Date   Acute colitis 04/2013   hospitalized. treated with cipro/flagyl   Arthritis    all over in both hips and Rt shoulder   Asthma    Chronic kidney disease    left ureteral stone   Diabetes  mellitus without complication (HCC)    History of kidney stones    Hypertension    Shoulder pain       Dispostion: I considered admission for this patient, but at this time she does not meet inpatient criteria for admission she is safe for discharge with outpatient follow-up and antibiotics.  Return precautions given which she voiced understanding.     Final Clinical Impression(s) / ED Diagnoses Final diagnoses:  None     @PCDICTATION @    Glendora Score, MD 04/15/23 [phone]

## 2023-07-15 ENCOUNTER — Encounter (INDEPENDENT_AMBULATORY_CARE_PROVIDER_SITE_OTHER): Payer: Self-pay | Admitting: *Deleted

## 2023-08-21 ENCOUNTER — Telehealth: Payer: Self-pay | Admitting: Internal Medicine

## 2023-08-21 NOTE — Telephone Encounter (Signed)
Received questionnaire. Pt will need OV prior to scheduling due to constipation/laxatives.

## 2023-08-22 ENCOUNTER — Encounter: Payer: Self-pay | Admitting: Internal Medicine

## 2023-09-12 ENCOUNTER — Telehealth: Payer: Self-pay | Admitting: *Deleted

## 2023-09-12 ENCOUNTER — Encounter: Payer: Self-pay | Admitting: *Deleted

## 2023-09-12 ENCOUNTER — Ambulatory Visit: Payer: Medicare Other | Admitting: Internal Medicine

## 2023-09-12 ENCOUNTER — Other Ambulatory Visit: Payer: Self-pay | Admitting: *Deleted

## 2023-09-12 VITALS — BP 163/77 | HR 90 | Temp 98.2°F | Ht 62.0 in | Wt 206.8 lb

## 2023-09-12 DIAGNOSIS — Z09 Encounter for follow-up examination after completed treatment for conditions other than malignant neoplasm: Secondary | ICD-10-CM | POA: Diagnosis not present

## 2023-09-12 DIAGNOSIS — Z860100 Personal history of colon polyps, unspecified: Secondary | ICD-10-CM

## 2023-09-12 DIAGNOSIS — Z860101 Personal history of adenomatous and serrated colon polyps: Secondary | ICD-10-CM | POA: Diagnosis not present

## 2023-09-12 MED ORDER — PEG 3350-KCL-NA BICARB-NACL 420 G PO SOLR: 4000.0000 mL | Freq: Once | ORAL | 0 refills | Status: AC

## 2023-09-12 NOTE — Progress Notes (Signed)
Primary Care Physician:  Guy Begin, MD Primary Gastroenterologist:  Dr. Jena Gauss  Pre-Procedure History & Physical: HPI:  Colleen Dixon is a 73 y.o. female here for for setting up a surveillance colonoscopy history of serrated adenoma removed 5 years ago.  No bowel symptoms at this time.  Significant intercurrent medical problems.  Had a reaction to the pneumococcal vaccine previously.  Past Medical History:  Diagnosis Date   Acute colitis 04/2013   hospitalized. treated with cipro/flagyl   Arthritis    all over in both hips and Rt shoulder   Asthma    Chronic kidney disease    left ureteral stone   Diabetes mellitus without complication (HCC)    History of kidney stones    Hypertension    Shoulder pain     Past Surgical History:  Procedure Laterality Date   ABDOMINAL HYSTERECTOMY     partial   COLONOSCOPY  02/29/2008   [id number] rectal polyp s/p bx and ablation/diminutive cecal polyps   COLONOSCOPY N/A 06/28/2013   Procedure: COLONOSCOPY;  Surgeon: Corbin Ade, MD;  [facility];  Service: Endoscopy;  Laterality: N/A;  8:30   COLONOSCOPY N/A 08/07/2018   Procedure: COLONOSCOPY;  Surgeon: Corbin Ade, MD;  [facility];  Service: Endoscopy;  Laterality: N/A;  2:15pm   CYSTOSCOPY WITH STENT PLACEMENT Left 01/30/2015   Procedure: CYSTOSCOPY WITH LEFT DOUBLE J URETERAL STENT PLACEMENT;  Surgeon: Marcine Matar, MD;  Location: [facility];  Service: Urology;  Laterality: Left;   fibroids removed     POLYPECTOMY  08/07/2018   Procedure: POLYPECTOMY;  Surgeon: Corbin Ade, MD;  [facility];  Service: Endoscopy;;  cecal(CSx2), descending colon(CSx1)   ROTATOR CUFF REPAIR     TONSILLECTOMY      Prior to Admission medications   Medication Sig Start Date End Date Taking? Authorizing Provider  acetaminophen (TYLENOL) 650 MG CR tablet Take 650 mg by mouth every 8 (eight) hours as needed for pain.    Yes [provider]   albuterol (PROVENTIL HFA;VENTOLIN HFA) 108 (90 Base) MCG/ACT inhaler Inhale 1-2 puffs into the lungs every 6 (six) hours as needed for wheezing or shortness of breath.    Yes [provider]  amLODipine (NORVASC) 5 MG tablet Take 5 mg by mouth every evening.  05/14/18  Yes [provider]  aspirin EC 81 MG tablet Take 81 mg by mouth 3 (three) times a week.   Yes [provider]  Flaxseed, Linseed, (FLAXSEED OIL) 1000 MG CAPS Take 1,000 mg by mouth daily.   Yes [provider]  lisinopril-hydrochlorothiazide (PRINZIDE,ZESTORETIC) 20-25 MG per tablet Take 1 tablet by mouth daily.   Yes [provider]  loratadine (CLARITIN) 10 MG tablet Take 10 mg by mouth daily.   Yes [provider]  Multiple Vitamin (MULTIVITAMIN WITH MINERALS) TABS tablet Take 1 tablet by mouth daily. Alive Women's Multivitamin 50+   Yes [provider]  naphazoline-pheniramine (ALLERGY EYE) 0.025-0.3 % ophthalmic solution Place 1 drop into both eyes 4 (four) times daily as needed for allergies.   Yes [provider]  Na Sulfate-K Sulfate-Mg Sulf 17.5-3.13-1.6 GM/177ML SOLN Take 1 kit by mouth as directed. Patient not taking: Reported on 09/12/2023 07/03/18   Corbin Ade, MD  Vitamins-Lipotropics (LIPO-FLAVONOID PLUS PO) Take 1 tablet by mouth daily as needed (ear ringing).  Patient not taking: Reported on 09/12/2023    [provider]    Allergies as  of 09/12/2023 - Review Complete 09/12/2023  Allergen Reaction Noted   Aspirin Nausea Only 05/04/2013    Family History  Problem Relation Age of Onset   Pancreatic cancer Mother    Dementia Father    Colon cancer Maternal Aunt     Social History   Socioeconomic History   Marital status: Married    Spouse name: Not on file   Number of children: Not on file   Years of education: Not on file   Highest education level: Not on file  Occupational History   Not on file  Tobacco Use    Smoking status: Former   Smokeless tobacco: Never  Vaping Use   Vaping status: Never Used  Substance and Sexual Activity   Alcohol use: Yes    Alcohol/week: 1.0 standard drink of alcohol    Types: 1 Glasses of wine per week    Comment: socially   Drug use: No   Sexual activity: Not on file  Other Topics Concern   Not on file  Social History Narrative   Not on file   Social Drivers of Health   Financial Resource Strain: Low Risk  (06/18/2023)   Received from [facility]   Overall Financial Resource Strain (CARDIA)    Difficulty of Paying Living Expenses: Not hard at all  Food Insecurity: No Food Insecurity (06/18/2023)   Received from [facility]   Hunger Vital Sign    Worried About Running Out of Food in the Last Year: Never true    Ran Out of Food in the Last Year: Never true  Transportation Needs: No Transportation Needs (06/18/2023)   Received from [facility] - Transportation    In the past 12 months, has lack of transportation kept you from medical appointments or from getting medications?: No    Lack of Transportation (Non-Medical): No  Physical Activity: Not on file  Stress: Not on file  Social Connections: Not on file  Intimate Partner Violence: Not on file    Review of Systems: See HPI, otherwise negative ROS  Physical Exam: BP (!) 163/77 (BP Location: Left Arm, Patient Position: Sitting, Cuff Size: Large)   Pulse 90   Temp 98.2 F (36.8 C) (Temporal)   Ht 5\' 2"  (1.575 m)   Wt 206 lb 12.8 oz (93.8 kg)   BMI 37.82 kg/m  General:   Alert,  Well-developed, well-nourished, pleasant and cooperative in NAD adenopathy. Lungs:  Clear throughout to auscultation.   No wheezes, crackles, or rhonchi. No acute distress. Heart:  Regular rate and rhythm; no murmurs, clicks, rubs,  or gallops. Abdomen: Non-distended, normal bowel sounds.  Soft and nontender without appreciable mass or hepatosplenomegaly.    Impression/Plan: 73 year old lady with history of multiple colonic adenomas removed over time.  She is due for surveillance colonoscopy.  I have offered this lady a surveillance colonoscopy.  The risks, benefits, limitations, alternatives and imponderables have been reviewed with the patient. Questions have been answered. All parties are agreeable.      Notice: This dictation was prepared with Dragon dictation along with smaller phrase technology. Any transcriptional errors that result from this process are unintentional and may not be corrected upon review.

## 2023-09-12 NOTE — Patient Instructions (Signed)
It was good to see you again today!  As discussed you are due to have a follow-up colonoscopy (history of colon polyps-ASA 2)  We will schedule this procedure in the near future.  Further recommendations to follow.

## 2023-09-12 NOTE — Telephone Encounter (Signed)
[employer] PA: Notification or Prior Authorization is not required for the requested services You are not required to submit a notification/prior authorization based on the information provided. If you have general questions about the prior authorization requirements, visit [email or web address] > Clinician Resources > Advance and Admission Notification Requirements. The number above acknowledges your notification. Please write this reference number down for future reference. If you would like to request an organization determination, please call us at [phone]. Decision ID #: [id number]

## 2023-10-13 NOTE — Addendum Note (Signed)
 Addended by: Elinor Dodge on: 10/13/2023 11:55 AM   Modules accepted: Orders

## 2023-10-14 ENCOUNTER — Other Ambulatory Visit (HOSPITAL_COMMUNITY)
Admission: RE | Admit: 2023-10-14 | Discharge: 2023-10-14 | Disposition: A | Discharge status: Home / Self Care | Source: Ambulatory Visit | Attending: Internal Medicine | Admitting: Internal Medicine

## 2023-10-14 DIAGNOSIS — Z860100 Personal history of colon polyps, unspecified: Secondary | ICD-10-CM | POA: Insufficient documentation

## 2023-10-14 DIAGNOSIS — Z09 Encounter for follow-up examination after completed treatment for conditions other than malignant neoplasm: Secondary | ICD-10-CM | POA: Diagnosis present

## 2023-10-14 LAB — BASIC METABOLIC PANEL
Anion gap: 10 (ref 5–15)
BUN: 13 mg/dL (ref 8–23)
CO2: 24 mmol/L (ref 22–32)
Calcium: 9.5 mg/dL (ref 8.9–10.3)
Chloride: 104 mmol/L (ref 98–111)
Creatinine, Ser: 0.66 mg/dL (ref 0.44–1.00)
GFR, Estimated: >60 mL/min (ref >60)
Glucose, Bld: 103 mg/dL — ABNORMAL HIGH (ref 70–99)
Potassium: 4 mmol/L (ref 3.5–5.1)
Sodium: 138 mmol/L (ref 135–145)

## 2023-10-15 NOTE — Anesthesia Preprocedure Evaluation (Signed)
 Anesthesia Evaluation  Patient identified by MRN, date of birth, ID band Patient awake    Reviewed: Allergy & Precautions, NPO status , Patient's Chart, lab work & pertinent test results  Airway Mallampati: III  TM Distance: >3 FB Neck ROM: Full    Dental  (+) Edentulous Upper, Edentulous Lower   Pulmonary asthma , former smoker   Pulmonary exam normal breath sounds clear to auscultation       Cardiovascular Exercise Tolerance: Good hypertension, Normal cardiovascular exam Rhythm:Regular Rate:Normal     Neuro/Psych    GI/Hepatic ,,Patient received Oral Contrast Agents,Occasional reflux; no meds   Endo/Other  diabetes, Well Controlled, Type 2    Renal/GU Renal InsufficiencyRenal disease     Musculoskeletal  (+) Arthritis , Osteoarthritis,    Abdominal   Peds  Hematology   Anesthesia Other Findings   Reproductive/Obstetrics                             Anesthesia Physical Anesthesia Plan  ASA: 3  Anesthesia Plan: MAC   Post-op Pain Management: Minimal or no pain anticipated   Induction: Intravenous  PONV Risk Score and Plan: Propofol infusion  Airway Management Planned: Nasal Cannula and Natural Airway  Additional Equipment: None  Intra-op Plan:   Post-operative Plan:   Informed Consent: I have reviewed the patients History and Physical, chart, labs and discussed the procedure including the risks, benefits and alternatives for the proposed anesthesia with the patient or authorized representative who has indicated his/her understanding and acceptance.       Plan Discussed with: Surgeon  Anesthesia Plan Comments:         Anesthesia Quick Evaluation

## 2023-10-16 ENCOUNTER — Other Ambulatory Visit: Payer: Self-pay

## 2023-10-16 ENCOUNTER — Ambulatory Visit (HOSPITAL_COMMUNITY): Admitting: Anesthesiology

## 2023-10-16 ENCOUNTER — Encounter (HOSPITAL_COMMUNITY): Payer: Self-pay | Admitting: Internal Medicine

## 2023-10-16 ENCOUNTER — Ambulatory Visit (HOSPITAL_COMMUNITY)
Admission: RE | Admit: 2023-10-16 | Discharge: 2023-10-16 | Disposition: A | Discharge status: Home / Self Care | Payer: Medicare Other | Attending: Internal Medicine | Admitting: Internal Medicine

## 2023-10-16 ENCOUNTER — Encounter (HOSPITAL_COMMUNITY)
Admission: RE | Disposition: A | Discharge status: Home / Self Care | Payer: Self-pay | Source: Home / Self Care | Attending: Internal Medicine

## 2023-10-16 DIAGNOSIS — Z87891 Personal history of nicotine dependence: Secondary | ICD-10-CM | POA: Insufficient documentation

## 2023-10-16 DIAGNOSIS — K5730 Diverticulosis of large intestine without perforation or abscess without bleeding: Secondary | ICD-10-CM | POA: Diagnosis not present

## 2023-10-16 DIAGNOSIS — E1122 Type 2 diabetes mellitus with diabetic chronic kidney disease: Secondary | ICD-10-CM | POA: Diagnosis not present

## 2023-10-16 DIAGNOSIS — Z1211 Encounter for screening for malignant neoplasm of colon: Secondary | ICD-10-CM

## 2023-10-16 DIAGNOSIS — I129 Hypertensive chronic kidney disease with stage 1 through stage 4 chronic kidney disease, or unspecified chronic kidney disease: Secondary | ICD-10-CM | POA: Insufficient documentation

## 2023-10-16 DIAGNOSIS — Z860101 Personal history of adenomatous and serrated colon polyps: Secondary | ICD-10-CM

## 2023-10-16 DIAGNOSIS — D123 Benign neoplasm of transverse colon: Secondary | ICD-10-CM | POA: Diagnosis not present

## 2023-10-16 DIAGNOSIS — I10 Essential (primary) hypertension: Secondary | ICD-10-CM

## 2023-10-16 DIAGNOSIS — N189 Chronic kidney disease, unspecified: Secondary | ICD-10-CM | POA: Diagnosis not present

## 2023-10-16 DIAGNOSIS — Z7951 Long term (current) use of inhaled steroids: Secondary | ICD-10-CM | POA: Insufficient documentation

## 2023-10-16 DIAGNOSIS — E1169 Type 2 diabetes mellitus with other specified complication: Secondary | ICD-10-CM

## 2023-10-16 DIAGNOSIS — J45909 Unspecified asthma, uncomplicated: Secondary | ICD-10-CM | POA: Diagnosis not present

## 2023-10-16 HISTORY — PX: POLYPECTOMY: SHX5525

## 2023-10-16 HISTORY — PX: COLONOSCOPY WITH PROPOFOL: SHX5780

## 2023-10-16 SURGERY — COLONOSCOPY WITH PROPOFOL
Anesthesia: Monitor Anesthesia Care

## 2023-10-16 MED ORDER — PHENYLEPHRINE 80 MCG/ML (10ML) SYRINGE FOR IV PUSH (FOR BLOOD PRESSURE SUPPORT)
PREFILLED_SYRINGE | INTRAVENOUS | Status: DC | PRN
  Administered 2023-10-16: 80 ug via INTRAVENOUS

## 2023-10-16 MED ORDER — SODIUM CHLORIDE 0.9% FLUSH: 3.0000 mL | Freq: Two times a day (BID) | INTRAVENOUS | Status: DC

## 2023-10-16 MED ORDER — PROPOFOL 500 MG/50ML IV EMUL
INTRAVENOUS | Status: DC | PRN
  Administered 2023-10-16: 200 ug/kg/min via INTRAVENOUS
  Administered 2023-10-16: 50 mg via INTRAVENOUS

## 2023-10-16 MED ORDER — SODIUM CHLORIDE 0.9% FLUSH: 3.0000 mL | INTRAVENOUS | Status: DC | PRN

## 2023-10-16 MED ORDER — LIDOCAINE HCL (CARDIAC) PF 100 MG/5ML IV SOSY
PREFILLED_SYRINGE | INTRAVENOUS | Status: DC | PRN
  Administered 2023-10-16: 40 mg via INTRAVENOUS

## 2023-10-16 MED ORDER — LACTATED RINGERS IV SOLN: INTRAVENOUS | Status: DC | PRN

## 2023-10-16 NOTE — Anesthesia Postprocedure Evaluation (Signed)
 Anesthesia Post Note  Patient: Colleen Dixon  Procedure(s) Performed: COLONOSCOPY WITH PROPOFOL POLYPECTOMY  Patient location during evaluation: PACU Anesthesia Type: General Level of consciousness: awake and alert Pain management: pain level controlled Vital Signs Assessment: post-procedure vital signs reviewed and stable Respiratory status: spontaneous breathing, nonlabored ventilation, respiratory function stable and patient connected to nasal cannula oxygen Cardiovascular status: blood pressure returned to baseline and stable Postop Assessment: no apparent nausea or vomiting Anesthetic complications: no   There were no known notable events for this encounter.   Last Vitals:  Vitals:   10/16/23 0847 10/16/23 0850  BP: (!) 94/39 (!) 96/44  Pulse: 74 75  Resp: (!) 24 (!) 22  Temp: 36.5 C   SpO2: 98% 100%    Last Pain:  Vitals:   10/16/23 0847  TempSrc: Oral  PainSc: 0-No pain                 Alanta Scobey L Tully Mcinturff

## 2023-10-16 NOTE — H&P (Signed)
 @LOGO @   Primary Care Physician:  Guy Begin, MD Primary Gastroenterologist:  Dr. Jena Gauss  Pre-Procedure History & Physical: HPI:  Colleen Dixon is a 73 y.o. female here for surveillance colonoscopy.  History of serrated adenomas removed previously.  Past Medical History:  Diagnosis Date   Acute colitis 04/2013   hospitalized. treated with cipro/flagyl   Arthritis    all over in both hips and Rt shoulder   Asthma    Chronic kidney disease    left ureteral stone   Diabetes mellitus without complication (HCC)    History of kidney stones    Hypertension    Shoulder pain     Past Surgical History:  Procedure Laterality Date   ABDOMINAL HYSTERECTOMY     partial   COLONOSCOPY  02/29/2008   BJY:NWGNFAOZHY rectal polyp s/p bx and ablation/diminutive cecal polyps   COLONOSCOPY N/A 06/28/2013   Procedure: COLONOSCOPY;  Surgeon: Corbin Ade, MD;  Location: [facility];  Service: Endoscopy;  Laterality: N/A;  8:30   COLONOSCOPY N/A 08/07/2018   Procedure: COLONOSCOPY;  Surgeon: Corbin Ade, MD;  Location: [facility];  Service: Endoscopy;  Laterality: N/A;  2:15pm   CYSTOSCOPY WITH STENT PLACEMENT Left 01/30/2015   Procedure: CYSTOSCOPY WITH LEFT DOUBLE J URETERAL STENT PLACEMENT;  Surgeon: Marcine Matar, MD;  Location: [facility];  Service: Urology;  Laterality: Left;   fibroids removed     POLYPECTOMY  08/07/2018   Procedure: POLYPECTOMY;  Surgeon: Corbin Ade, MD;  Location: [facility];  Service: Endoscopy;;  cecal(CSx2), descending colon(CSx1)   ROTATOR CUFF REPAIR     TONSILLECTOMY      Prior to Admission medications   Medication Sig Start Date End Date Taking? Authorizing Provider  acetaminophen (TYLENOL) 650 MG CR tablet Take 650 mg by mouth every 8 (eight) hours as needed for pain.    Yes [provider]  albuterol (PROVENTIL HFA;VENTOLIN HFA) 108 (90 Base) MCG/ACT inhaler Inhale 1-2 puffs into the lungs every 6 (six) hours as needed for wheezing  or shortness of breath.    Yes [provider]  amLODipine (NORVASC) 5 MG tablet Take 5 mg by mouth every evening.  05/14/18  Yes [provider]  atorvastatin (LIPITOR) 20 MG tablet Take 20 mg by mouth at bedtime.   Yes [provider]  budesonide-formoterol (SYMBICORT) 160-4.5 MCG/ACT inhaler Inhale 2 puffs into the lungs 2 (two) times daily.   Yes [provider]  fluticasone (VERAMYST) 27.5 MCG/SPRAY nasal spray Place 2 sprays into the nose daily.   Yes [provider]  lisinopril-hydrochlorothiazide (PRINZIDE,ZESTORETIC) 20-25 MG per tablet Take 1 tablet by mouth daily.   Yes [provider]  loratadine (CLARITIN) 10 MG tablet Take 10 mg by mouth daily.   Yes [provider]  montelukast (SINGULAIR) 10 MG tablet Take 10 mg by mouth at bedtime.   Yes [provider]  Multiple Vitamin (MULTIVITAMIN WITH MINERALS) TABS tablet Take 1 tablet by mouth daily. Alive Women's Multivitamin 50+   Yes [provider]  naphazoline-pheniramine (ALLERGY EYE) 0.025-0.3 % ophthalmic solution Place 1 drop into both eyes 4 (four) times daily as needed for allergies.   Yes [provider]  aspirin EC 81 MG tablet Take 81 mg by mouth 3 (three) times a week. Pt not taking    [provider]  Flaxseed, Linseed, (FLAXSEED OIL) 1000 MG CAPS Take 1,000 mg by mouth daily.    [provider]  Na Sulfate-K  Sulfate-Mg Sulf 17.5-3.13-1.6 GM/177ML SOLN Take 1 kit by mouth as directed. Patient not taking: Reported on 09/12/2023 07/03/18   Corbin Ade, MD  Vitamins-Lipotropics (LIPO-FLAVONOID PLUS PO) Take 1 tablet by mouth daily as needed (ear ringing).  Patient not taking: Reported on 09/12/2023    [provider]    Allergies as of 09/12/2023 - Review Complete 09/12/2023  Allergen Reaction Noted   Aspirin Nausea Only 05/04/2013    Family History  Problem Relation Age of Onset   Pancreatic cancer Mother     Dementia Father    Colon cancer Maternal Aunt     Social History   Socioeconomic History   Marital status: Married    Spouse name: Not on file   Number of children: Not on file   Years of education: Not on file   Highest education level: Not on file  Occupational History   Not on file  Tobacco Use   Smoking status: Former   Smokeless tobacco: Never  Vaping Use   Vaping status: Never Used  Substance and Sexual Activity   Alcohol use: Yes    Alcohol/week: 1.0 standard drink of alcohol    Types: 1 Glasses of wine per week    Comment: socially   Drug use: No   Sexual activity: Not on file  Other Topics Concern   Not on file  Social History Narrative   Not on file   Social Drivers of Health   Financial Resource Strain: Low Risk  (09/29/2023)   Received from [facility]   Overall Financial Resource Strain (CARDIA)    Difficulty of Paying Living Expenses: Not hard at all  Food Insecurity: No Food Insecurity (09/29/2023)   Received from [facility]   Hunger Vital Sign    Worried About Running Out of Food in the Last Year: Never true    Ran Out of Food in the Last Year: Never true  Transportation Needs: No Transportation Needs (09/29/2023)   Received from [facility] - Transportation    In the past 12 months, has lack of transportation kept you from medical appointments or from getting medications?: No    Lack of Transportation (Non-Medical): No  Physical Activity: Not on file  Stress: Not on file  Social Connections: Not on file  Intimate Partner Violence: Not on file    Review of Systems: See HPI, otherwise negative ROS  Physical Exam: BP 135/73   Pulse 93   Temp 98.3 F (36.8 C) (Oral)   Resp 20   Ht 5\' 3"  (1.6 m)   Wt 94.3 kg   SpO2 96%   BMI 36.85 kg/m  General:   Alert,  Well-developed, well-nourished, pleasant and cooperative in NAD Neck:  Supple; no masses or thyromegaly. No significant  cervical adenopathy. Lungs:  Clear throughout to auscultation.   No wheezes, crackles, or rhonchi. No acute distress. Heart:  Regular rate and rhythm; no murmurs, clicks, rubs,  or gallops. Abdomen: Non-distended, normal bowel sounds.  Soft and nontender without appreciable mass or hepatosplenomegaly.  Impression/Plan: 72 year old lady here for surveillance colonoscopy.  History of serrated adenomas removed previously. The risks, benefits, limitations, alternatives and imponderables have been reviewed with the patient. Questions have been answered. All parties are agreeable.       Notice: This dictation was prepared with Dragon dictation along with smaller phrase technology. Any transcriptional errors that result from this process are unintentional and may not be corrected upon  review.

## 2023-10-16 NOTE — Op Note (Signed)
 [facility] Patient Name: Colleen Dixon Procedure Date: 10/16/2023 8:04 AM MRN: [id number] Date of Birth: 21-Jun-1951 Attending MD: Gennette Pac , MD, [id number] CSN: [id number] Age: 73 Admit Type: Outpatient Procedure:                Colonoscopy Indications:              High risk colon cancer surveillance: Personal                            history of colonic polyps Providers:                Gennette Pac, MD, Buel Ream. Thomasena Edis RN, RN,                            Elinor Parkinson Referring MD:              Medicines:                Propofol per Anesthesia Complications:            No immediate complications. Estimated Blood Loss:     Estimated blood loss was minimal. Procedure:                Pre-Anesthesia Assessment:                           - Prior to the procedure, a History and Physical                            was performed, and patient medications and                            allergies were reviewed. The patient's tolerance of                            previous anesthesia was also reviewed. The risks                            and benefits of the procedure and the sedation                            options and risks were discussed with the patient.                            All questions were answered, and informed consent                            was obtained. Prior Anticoagulants: The patient has                            taken no anticoagulant or antiplatelet agents. ASA                            Grade Assessment: II - A patient with mild systemic  disease. After reviewing the risks and benefits,                            the patient was deemed in satisfactory condition to                            undergo the procedure.                           After obtaining informed consent, the colonoscope                            was passed under direct vision. Throughout the                            procedure, the  patient's blood pressure, pulse, and                            oxygen saturations were monitored continuously. The                            [id number]) scope was introduced through the                            anus and advanced to the the cecum, identified by                            appendiceal orifice and ileocecal valve. The                            colonoscopy was performed without difficulty. The                            patient tolerated the procedure well. The quality                            of the bowel preparation was adequate. The                            ileocecal valve, appendiceal orifice, and rectum                            were photographed. The entire colon was well                            visualized. Scope In: 8:25:18 AM Scope Out: 8:42:53 AM Scope Withdrawal Time: 0 hours 12 minutes 47 seconds  Total Procedure Duration: 0 hours 17 minutes 35 seconds  Findings:      The perianal and digital rectal examinations were normal.      Scattered medium-mouthed diverticula were found in the sigmoid colon and       descending colon.      Two sessile polyps were found in the splenic flexure and hepatic       flexure. The polyps were 4 to 6 mm in  size. These polyps were removed       with a cold snare. Resection and retrieval were complete. Estimated       blood loss was minimal.      The exam was otherwise without abnormality on direct and retroflexion       views. Impression:               - Diverticulosis in the sigmoid colon and in the                            descending colon.                           - Two 4 to 6 mm polyps at the splenic flexure and                            at the hepatic flexure, removed with a cold snare.                            Resected and retrieved.                           - The examination was otherwise normal on direct                            and retroflexion views. Moderate Sedation:      Moderate  (conscious) sedation was personally administered by an       anesthesia professional. The following parameters were monitored: oxygen       saturation, heart rate, blood pressure, respiratory rate, EKG, adequacy       of pulmonary ventilation, and response to care. Recommendation:           - Patient has a contact number available for                            emergencies. The signs and symptoms of potential                            delayed complications were discussed with the                            patient. Return to normal activities tomorrow.                            Written discharge instructions were provided to the                            patient.                           - Advance diet as tolerated.                           - Continue present medications.                           -  Repeat colonoscopy date to be determined after                            pending pathology results are reviewed for                            surveillance.                           - Return to GI office (date not yet determined). Procedure Code(s):        --- Professional ---                           [phone], Colonoscopy, flexible; with removal of                            tumor(s), polyp(s), or other lesion(s) by snare                            technique Diagnosis Code(s):        --- Professional ---                           Z86.010, Personal history of colonic polyps                           D12.3, Benign neoplasm of transverse colon (hepatic                            flexure or splenic flexure)                           K57.30, Diverticulosis of large intestine without                            perforation or abscess without bleeding CPT copyright 2022 [employer]. All rights reserved. The codes documented in this report are preliminary and upon coder review may  be revised to meet current compliance requirements. Gerrit Friends. Cala Kruckenberg, MD Gennette Pac,  MD 10/16/2023 8:49:31 AM This report has been signed electronically. Number of Addenda: 0

## 2023-10-16 NOTE — Discharge Instructions (Signed)
  Colonoscopy Discharge Instructions  Read the instructions outlined below and refer to this sheet in the next few weeks. These discharge instructions provide you with general information on caring for yourself after you leave the hospital. Your doctor may also give you specific instructions. While your treatment has been planned according to the most current medical practices available, unavoidable complications occasionally occur. If you have any problems or questions after discharge, call Dr. Jena Gauss at [phone]. ACTIVITY You may resume your regular activity, but move at a slower pace for the next 24 hours.  Take frequent rest periods for the next 24 hours.  Walking will help get rid of the air and reduce the bloated feeling in your belly (abdomen).  No driving for 24 hours (because of the medicine (anesthesia) used during the test).   Do not sign any important legal documents or operate any machinery for 24 hours (because of the anesthesia used during the test).  NUTRITION Drink plenty of fluids.  You may resume your normal diet as instructed by your doctor.  Begin with a light meal and progress to your normal diet. Heavy or fried foods are harder to digest and may make you feel sick to your stomach (nauseated).  Avoid alcoholic beverages for 24 hours or as instructed.  MEDICATIONS You may resume your normal medications unless your doctor tells you otherwise.  WHAT YOU CAN EXPECT TODAY Some feelings of bloating in the abdomen.  Passage of more gas than usual.  Spotting of blood in your stool or on the toilet paper.  IF YOU HAD POLYPS REMOVED DURING THE COLONOSCOPY: No aspirin products for 7 days or as instructed.  No alcohol for 7 days or as instructed.  Eat a soft diet for the next 24 hours.  FINDING OUT THE RESULTS OF YOUR TEST Not all test results are available during your visit. If your test results are not back during the visit, make an appointment with your caregiver to find out the  results. Do not assume everything is normal if you have not heard from your caregiver or the medical facility. It is important for you to follow up on all of your test results.  SEEK IMMEDIATE MEDICAL ATTENTION IF: You have more than a spotting of blood in your stool.  Your belly is swollen (abdominal distention).  You are nauseated or vomiting.  You have a temperature over 101.  You have abdominal pain or discomfort that is severe or gets worse throughout the day.     2 polyps found and removed today  Diverticulosis and colon polyp information provided   further recommendations to follow once polyp report comes back for review

## 2023-10-16 NOTE — Transfer of Care (Signed)
 Immediate Anesthesia Transfer of Care Note  Patient: Colleen Dixon  Procedure(s) Performed: COLONOSCOPY WITH PROPOFOL POLYPECTOMY  Patient Location: PACU  Anesthesia Type:General  Level of Consciousness: awake, alert , and oriented  Airway & Oxygen Therapy: Patient Spontanous Breathing  Post-op Assessment: Report given to RN and Post -op Vital signs reviewed and stable  Post vital signs: Reviewed and stable  Last Vitals:  Vitals Value Taken Time  BP 94/39 10/16/23 0847  Temp 36.5 C 10/16/23 0847  Pulse 74 10/16/23 0847  Resp 24 10/16/23 0847  SpO2 98 % 10/16/23 0847    Last Pain:  Vitals:   10/16/23 0847  TempSrc: Oral  PainSc: 0-No pain      Patients Stated Pain Goal: 8 (10/16/23 0716)  Complications: No notable events documented.

## 2023-10-17 ENCOUNTER — Encounter (HOSPITAL_COMMUNITY): Payer: Self-pay | Admitting: Internal Medicine

## 2023-10-17 LAB — SURGICAL PATHOLOGY

## 2023-10-18 ENCOUNTER — Encounter: Payer: Self-pay | Admitting: Internal Medicine

## 2024-01-26 ENCOUNTER — Other Ambulatory Visit (HOSPITAL_COMMUNITY): Payer: Self-pay | Admitting: Family Medicine

## 2024-01-26 DIAGNOSIS — Z1231 Encounter for screening mammogram for malignant neoplasm of breast: Secondary | ICD-10-CM

## 2024-02-23 ENCOUNTER — Ambulatory Visit (HOSPITAL_COMMUNITY)
Admission: RE | Admit: 2024-02-23 | Discharge: 2024-02-23 | Disposition: A | Discharge status: Home / Self Care | Source: Ambulatory Visit | Attending: Family Medicine | Admitting: Family Medicine

## 2024-02-23 DIAGNOSIS — Z1231 Encounter for screening mammogram for malignant neoplasm of breast: Secondary | ICD-10-CM | POA: Diagnosis present
